# Patient Record
Sex: Female | Born: 1975 | Race: White | Hispanic: No | Marital: Married | Smoking: Never smoker
Health system: Southern US, Community
[De-identification: ages and names within clinical notes are randomized; demographics above are authoritative.]

## PROBLEM LIST (undated history)

## (undated) DIAGNOSIS — F319 Bipolar disorder, unspecified: Secondary | ICD-10-CM

## (undated) DIAGNOSIS — Z8659 Personal history of other mental and behavioral disorders: Secondary | ICD-10-CM

## (undated) DIAGNOSIS — D649 Anemia, unspecified: Secondary | ICD-10-CM

## (undated) DIAGNOSIS — R7303 Prediabetes: Secondary | ICD-10-CM

## (undated) DIAGNOSIS — Z87442 Personal history of urinary calculi: Secondary | ICD-10-CM

## (undated) DIAGNOSIS — R5383 Other fatigue: Secondary | ICD-10-CM

## (undated) HISTORY — PX: OTHER SURGICAL HISTORY: SHX169

---

## 2001-03-09 ENCOUNTER — Emergency Department (HOSPITAL_COMMUNITY): Admission: EM | Admit: 2001-03-09 | Discharge: 2001-03-09 | Payer: Self-pay | Admitting: Emergency Medicine

## 2001-03-09 ENCOUNTER — Encounter: Payer: Self-pay | Admitting: Emergency Medicine

## 2004-03-04 ENCOUNTER — Other Ambulatory Visit: Admission: RE | Admit: 2004-03-04 | Discharge: 2004-03-04 | Payer: Self-pay | Admitting: Obstetrics and Gynecology

## 2004-10-28 ENCOUNTER — Inpatient Hospital Stay (HOSPITAL_COMMUNITY): Admission: AD | Admit: 2004-10-28 | Discharge: 2004-10-31 | Payer: Self-pay | Admitting: Obstetrics and Gynecology

## 2004-10-29 ENCOUNTER — Encounter (INDEPENDENT_AMBULATORY_CARE_PROVIDER_SITE_OTHER): Payer: Self-pay | Admitting: Specialist

## 2004-12-06 ENCOUNTER — Encounter: Admission: RE | Admit: 2004-12-06 | Discharge: 2005-01-05 | Payer: Self-pay | Admitting: Obstetrics and Gynecology

## 2004-12-18 ENCOUNTER — Other Ambulatory Visit: Admission: RE | Admit: 2004-12-18 | Discharge: 2004-12-18 | Payer: Self-pay | Admitting: Obstetrics and Gynecology

## 2005-01-06 ENCOUNTER — Encounter: Admission: RE | Admit: 2005-01-06 | Discharge: 2005-02-05 | Payer: Self-pay | Admitting: Obstetrics and Gynecology

## 2005-03-06 ENCOUNTER — Encounter: Admission: RE | Admit: 2005-03-06 | Discharge: 2005-04-05 | Payer: Self-pay | Admitting: Obstetrics and Gynecology

## 2005-05-06 ENCOUNTER — Encounter: Admission: RE | Admit: 2005-05-06 | Discharge: 2005-06-05 | Payer: Self-pay | Admitting: Obstetrics and Gynecology

## 2005-07-06 ENCOUNTER — Encounter: Admission: RE | Admit: 2005-07-06 | Discharge: 2005-08-05 | Payer: Self-pay | Admitting: Obstetrics and Gynecology

## 2005-08-06 ENCOUNTER — Encounter: Admission: RE | Admit: 2005-08-06 | Discharge: 2005-09-04 | Payer: Self-pay | Admitting: Obstetrics and Gynecology

## 2005-09-05 ENCOUNTER — Encounter: Admission: RE | Admit: 2005-09-05 | Discharge: 2005-10-05 | Payer: Self-pay | Admitting: Obstetrics and Gynecology

## 2005-10-06 ENCOUNTER — Encounter: Admission: RE | Admit: 2005-10-06 | Discharge: 2005-11-04 | Payer: Self-pay | Admitting: Obstetrics and Gynecology

## 2005-11-05 ENCOUNTER — Encounter: Admission: RE | Admit: 2005-11-05 | Discharge: 2005-12-05 | Payer: Self-pay | Admitting: Obstetrics and Gynecology

## 2005-12-06 ENCOUNTER — Encounter: Admission: RE | Admit: 2005-12-06 | Discharge: 2006-01-05 | Payer: Self-pay | Admitting: Obstetrics and Gynecology

## 2006-01-06 ENCOUNTER — Encounter: Admission: RE | Admit: 2006-01-06 | Discharge: 2006-02-02 | Payer: Self-pay | Admitting: Obstetrics and Gynecology

## 2006-02-03 ENCOUNTER — Encounter: Admission: RE | Admit: 2006-02-03 | Discharge: 2006-03-05 | Payer: Self-pay | Admitting: Obstetrics and Gynecology

## 2006-02-27 ENCOUNTER — Other Ambulatory Visit: Admission: RE | Admit: 2006-02-27 | Discharge: 2006-02-27 | Payer: Self-pay | Admitting: Obstetrics and Gynecology

## 2006-03-06 ENCOUNTER — Encounter: Admission: RE | Admit: 2006-03-06 | Discharge: 2006-04-05 | Payer: Self-pay | Admitting: Obstetrics and Gynecology

## 2006-06-29 ENCOUNTER — Emergency Department (HOSPITAL_COMMUNITY): Admission: EM | Admit: 2006-06-29 | Discharge: 2006-06-29 | Payer: Self-pay | Admitting: Emergency Medicine

## 2007-02-24 ENCOUNTER — Emergency Department (HOSPITAL_COMMUNITY): Admission: EM | Admit: 2007-02-24 | Discharge: 2007-02-24 | Payer: Self-pay | Admitting: Emergency Medicine

## 2007-05-13 ENCOUNTER — Other Ambulatory Visit: Admission: RE | Admit: 2007-05-13 | Discharge: 2007-05-13 | Payer: Self-pay | Admitting: Obstetrics and Gynecology

## 2011-03-28 NOTE — Discharge Summary (Signed)
NAME:  Lauren Stafford, Lauren Stafford           ACCOUNT NO.:  [id number]   MEDICAL RECORD NO.:  [id number]          PATIENT TYPE:  INP   LOCATION:  9303                          FACILITY:  [facility]   PHYSICIAN:  James A. Ashley Royalty, M.D.DATE OF BIRTH:  02-12-1976   DATE OF ADMISSION:  10/28/2004  DATE OF DISCHARGE:  10/31/2004                                 DISCHARGE SUMMARY   DISCHARGE DIAGNOSES:  1.  Intrauterine pregnancy at 39 weeks 3 days gestation, delivered.  2.  Term birth, living child.  3.  Neonate under evaluation for possible cardiac and/or chromosomal      abnormality and transferred to [place] [facility] [place] at [facility] for completion of that workup.   CONSULTATIONS:  None.   DISCHARGE MEDICATIONS:  1.  Augmentin.  2.  Percocet.  3.  Motrin.  4.  Colace.   HISTORY AND PHYSICAL:  This is a 35 year old gravida 2 para 0 AB 1, EDC  November 01, 2004, currently 39 weeks 3 days gestation.  Prenatal care was  complicated by a vesicular eruption on the right index finger which was  cultured for herpes and found to be negative.  The patient also had modest  depression but no medications required.  She was admitted for induction of  labor due to advanced cervical changes and musculoskeletal discomfort.  For  the remainder of the history and physical, please see chart.   HOSPITAL COURSE:  The patient was admitted to [facility] [place].  Admission laboratory studies were drawn.  On October 29, 2004,  artificial rupture of membranes was accomplished, which revealed clear  fluid.  Full ____________ were placed.  Meconium was later noted.  In  addition, the patient developed a maternal fever consistent with the  diagnosis of chorioamnionitis.  She was later felt to be a candidate for an  instrument delivery due to fetal tachycardia and the low station of the  presenting part.   At 6:59 p.m., there was brought a 3421 g female, Apgars 6 at one minute and  8 at five  minutes, sent to the neonatal intensive care unit.  Delivery was  accomplished from a +3 station, LOA, with the vacuum extractor for the above  indications.  The infant was DeLeed on the perineum, and 3 cc of meconium  was noted.  Pediatrics team was present at delivery.  Arterial cord pH was  7.28.  Delivery was accomplished over a second-degree midline episiotomy  which did extend to a fourth degree and was repaired without difficulty.  The infant was taken by pediatrics team to the NICU secondary to grunting to  check for meconium aspiration.  Also, the feet and hands were very swollen,  and the etiology was not immediately known.  Workup was initiated.  The  patient was continued on IV antibiotics postpartum for the presumed  chorioamnionitis.   On October 30, 2004, the patient stated the infant was transferred to [facility] due to a presumed cardiac defect.  There was the possibility of a  chromosome anomaly as well.  The patient herself did  quite well from a  physical standpoint.  IV antibiotics were continued.  On October 31, 2004,  the patient continued to be afebrile.  She appeared stable for discharge and  was discharged home in satisfactory condition.  She also stated at that time  that the baby underwent an aortic valvulotomy at [facility] which went very  well.  Workup is continuing.   The patient was discharged home on the same calendar day afebrile and in  satisfactory condition.  Discharge hemoglobin was 8.9, hematocrit 25.7.  Full discharge instructions were given.      JAM/MEDQ  D:  11/27/2004  T:  11/27/2004  Job:  [id number]

## 2011-03-28 NOTE — H&P (Signed)
NAME:  Lauren Stafford, Lauren Stafford           ACCOUNT NO.:  [id number]   MEDICAL RECORD NO.:  [id number]           PATIENT TYPE:   LOCATION:                                 FACILITY:   PHYSICIAN:  Rudy Jew. Ashley Royalty, M.D.     DATE OF BIRTH:   DATE OF ADMISSION:  10/28/2004  DATE OF DISCHARGE:                                HISTORY & PHYSICAL   HISTORY OF PRESENT ILLNESS:  This is a 35 year old gravida 2, para 0, AB 1,  EDC November 01, 2004, currently 39 weeks, three days gestation.  Prenatal  care has been complicated by an eruption of the right index finger which was  cultured for Herpes and the culture was negative.  The patient also has had  modest depression, but has been under no treatment and had no complications  therein.   The patient is admitted for induction due to musculoskeletal discomfort in  the face of a favorable cervix.   MEDICATIONS:  Vitamins.   PAST MEDICAL HISTORY:  Negative, save for the aforementioned items.   PAST SURGICAL HISTORY:  History of throat surgery - benign.   ALLERGIES:  None.   FAMILY HISTORY:  Noncontributory.   SOCIAL HISTORY:  The patient denies use of tobacco or significant alcohol.   REVIEW OF SYSTEMS:  Noncontributory.   PHYSICAL EXAMINATION:  GENERAL:  Well-developed, well-nourished, pleasant  white female in no acute distress.  VITAL SIGNS:  Afebrile, vital signs stable.  Weight 200 pounds.  SKIN:  Warm and dry without lesions.  LYMPH:  There is no supraclavicular, cervical, or inguinal adenopathy.  HEENT:  Normocephalic.  NECK:  Supple without thyromegaly.  CHEST:  Lungs are clear.  CARDIAC:  Regular rate and rhythm without murmurs, gallops, or rubs.  BREASTS:  Soft without palpable mass, discharge, tracks, or adenopathy.  ABDOMEN:  Soft, nontender without organomegaly.  There is a term fundal  height.  Fetal heart tones are auscultating.  MUSCULOSKELETAL:  Examination reveals full range of motion without edema,  cyanosis, or CVA  tenderness.  PELVIC:  Cervix is loose, fingertip dilatation, 75%, -2 to -3 station,  vertex presentation.   IMPRESSION:  1.  Intrauterine pregnancy at 39 weeks, three days gestation.  2.  Musculoskeletal discomfort at term with favorable cervix.   PLAN:  The patient is admitted for induction.  She is group B Strep  negative.      JAM/MEDQ  D:  10/28/2004  T:  10/28/2004  Job:  [id number]

## 2014-09-03 ENCOUNTER — Encounter (HOSPITAL_COMMUNITY): Payer: Self-pay | Admitting: Emergency Medicine

## 2014-09-03 ENCOUNTER — Emergency Department (HOSPITAL_COMMUNITY)
Admission: EM | Admit: 2014-09-03 | Discharge: 2014-09-04 | Disposition: A | Discharge status: Home / Self Care | Payer: BC Managed Care – PPO | Attending: Emergency Medicine | Admitting: Emergency Medicine

## 2014-09-03 DIAGNOSIS — R1031 Right lower quadrant pain: Secondary | ICD-10-CM | POA: Diagnosis not present

## 2014-09-03 DIAGNOSIS — Z79899 Other long term (current) drug therapy: Secondary | ICD-10-CM | POA: Insufficient documentation

## 2014-09-03 DIAGNOSIS — R109 Unspecified abdominal pain: Secondary | ICD-10-CM | POA: Diagnosis present

## 2014-09-03 DIAGNOSIS — Z3202 Encounter for pregnancy test, result negative: Secondary | ICD-10-CM | POA: Diagnosis not present

## 2014-09-03 DIAGNOSIS — R319 Hematuria, unspecified: Secondary | ICD-10-CM

## 2014-09-03 LAB — URINALYSIS, ROUTINE W REFLEX MICROSCOPIC
Bilirubin Urine: NEGATIVE
GLUCOSE, UA: NEGATIVE mg/dL
KETONES UR: NEGATIVE mg/dL
Leukocytes, UA: NEGATIVE
NITRITE: NEGATIVE
PROTEIN: NEGATIVE mg/dL
Specific Gravity, Urine: 1.02 (ref 1.005–1.030)
Urobilinogen, UA: 0.2 mg/dL (ref 0.0–1.0)
pH: 5.5 (ref 5.0–8.0)

## 2014-09-03 LAB — COMPREHENSIVE METABOLIC PANEL
ALT: 33 U/L (ref 0–35)
AST: 20 U/L (ref 0–37)
Albumin: 3.7 g/dL (ref 3.5–5.2)
Alkaline Phosphatase: 80 U/L (ref 39–117)
Anion gap: 12 (ref 5–15)
BUN: 12 mg/dL (ref 6–23)
CO2: 27 mEq/L (ref 19–32)
Calcium: 9.9 mg/dL (ref 8.4–10.5)
Chloride: 103 mEq/L (ref 96–112)
Creatinine, Ser: 0.68 mg/dL (ref 0.50–1.10)
GFR calc Af Amer: >90 mL/min (ref >90)
GFR calc non Af Amer: >90 mL/min (ref >90)
Glucose, Bld: 96 mg/dL (ref 70–99)
Potassium: 4.3 mEq/L (ref 3.7–5.3)
Sodium: 142 mEq/L (ref 137–147)
Total Bilirubin: 0.2 mg/dL — ABNORMAL LOW (ref 0.3–1.2)
Total Protein: 7.8 g/dL (ref 6.0–8.3)

## 2014-09-03 LAB — CBC WITH DIFFERENTIAL/PLATELET
BASOS ABS: 0.1 10*3/uL (ref 0.0–0.1)
Basophils Relative: 1 % (ref 0–1)
EOS ABS: 0.3 10*3/uL (ref 0.0–0.7)
EOS PCT: 2 % (ref 0–5)
HCT: 35.6 % — ABNORMAL LOW (ref 36.0–46.0)
Hemoglobin: 11.6 g/dL — ABNORMAL LOW (ref 12.0–15.0)
LYMPHS ABS: 3.2 10*3/uL (ref 0.7–4.0)
LYMPHS PCT: 27 % (ref 12–46)
MCH: 25.9 pg — AB (ref 26.0–34.0)
MCHC: 32.6 g/dL (ref 30.0–36.0)
MCV: 79.5 fL (ref 78.0–100.0)
Monocytes Absolute: 0.8 10*3/uL (ref 0.1–1.0)
Monocytes Relative: 6 % (ref 3–12)
NEUTROS PCT: 64 % (ref 43–77)
Neutro Abs: 7.7 10*3/uL (ref 1.7–7.7)
Platelets: 345 10*3/uL (ref 150–400)
RBC: 4.48 MIL/uL (ref 3.87–5.11)
RDW: 14.2 % (ref 11.5–15.5)
WBC: 12 10*3/uL — AB (ref 4.0–10.5)

## 2014-09-03 LAB — URINE MICROSCOPIC-ADD ON

## 2014-09-03 LAB — PREGNANCY, URINE: PREG TEST UR: NEGATIVE

## 2014-09-03 MED ORDER — MORPHINE SULFATE 4 MG/ML IJ SOLN
4.0000 mg | Freq: Once | INTRAMUSCULAR | Status: AC
  Administered 2014-09-03: 4 mg via INTRAVENOUS
  Filled 2014-09-03: qty 1

## 2014-09-03 MED ORDER — KETOROLAC TROMETHAMINE 15 MG/ML IJ SOLN
15.0000 mg | Freq: Once | INTRAMUSCULAR | Status: AC
  Administered 2014-09-03: 15 mg via INTRAVENOUS
  Filled 2014-09-03: qty 1

## 2014-09-03 NOTE — ED Notes (Signed)
Pt. reports right flank pain and "tingling " when urinating onset Friday last week , denies dysuria or hematuria , no fever or chills.

## 2014-09-04 MED ORDER — ONDANSETRON 4 MG PO TBDP: ORAL_TABLET | ORAL | Status: DC

## 2014-09-04 MED ORDER — HYDROCODONE-ACETAMINOPHEN 5-325 MG PO TABS: 1.0000 | ORAL_TABLET | ORAL | Status: DC | PRN

## 2014-09-04 NOTE — ED Provider Notes (Signed)
CSN: [id number]     Arrival date & time 09/03/14  1937 History   First MD Initiated Contact with Patient 09/03/14 2301     Chief Complaint  Patient presents with  . Flank Pain     (Consider location/radiation/quality/duration/timing/severity/associated sxs/prior Treatment) HPI Comments: 38 year old female with no significant medical history presents with right flank pain intermittent and tingling pressure when urinating since Thursday or Friday last week. Symptoms are intermittent and patient has times of no symptoms at all. No history of kidney stones or appendicitis. Currently symptoms mild. Pain comes in waves. No family history of kidney stones known, possibly in the mother.  Patient is a 38 y.o. female presenting with flank pain. The history is provided by the patient.  Flank Pain Pertinent negatives include no chest pain, no abdominal pain, no headaches and no shortness of breath.    History reviewed. No pertinent past medical history. Past Surgical History  Procedure Laterality Date  . Arm surgery     No family history on file. History  Substance Use Topics  . Smoking status: Never Smoker   . Smokeless tobacco: Not on file  . Alcohol Use: Yes   OB History   Grav Para Term Preterm Abortions TAB SAB Ect Mult Living                 Review of Systems  Constitutional: Positive for appetite change. Negative for fever and chills.  HENT: Negative for congestion.   Eyes: Negative for visual disturbance.  Respiratory: Negative for shortness of breath.   Cardiovascular: Negative for chest pain.  Gastrointestinal: Negative for vomiting and abdominal pain.  Genitourinary: Positive for flank pain. Negative for dysuria.  Musculoskeletal: Negative for back pain, neck pain and neck stiffness.  Skin: Negative for rash.  Neurological: Negative for light-headedness and headaches.      Allergies  Review of patient's allergies indicates no known allergies.  Home Medications    Prior to Admission medications   Medication Sig Start Date End Date Taking? Authorizing Provider  lithium carbonate 300 MG capsule Take 300 mg by mouth at bedtime.   Yes Historical Provider, MD  HYDROcodone-acetaminophen (NORCO) 5-325 MG per tablet Take 1-2 tablets by mouth every 4 (four) hours as needed. 09/04/14   Enid SkeensJoshua M Romelle Reiley, MD  ondansetron (ZOFRAN ODT) 4 MG disintegrating tablet 4mg  ODT q4 hours prn nausea/vomit 09/04/14   Enid SkeensJoshua M Trigger Frasier, MD   BP 115/67  Pulse 72  Temp(Src) 98.6 F (37 C) (Oral)  Resp 18  Ht 5\' 8"  (1.727 m)  Wt 223 lb (101.152 kg)  BMI 33.91 kg/m2  SpO2 100%  LMP 08/23/2014 Physical Exam  Nursing note and vitals reviewed. Constitutional: She is oriented to person, place, and time. She appears well-developed and well-nourished.  HENT:  Head: Normocephalic and atraumatic.  Eyes: Conjunctivae are normal. Right eye exhibits no discharge. Left eye exhibits no discharge.  Neck: Normal range of motion. Neck supple. No tracheal deviation present.  Cardiovascular: Normal rate and regular rhythm.   Pulmonary/Chest: Effort normal and breath sounds normal.  Abdominal: Soft. She exhibits no distension. There is no tenderness. There is no guarding.  Musculoskeletal: She exhibits tenderness (mild right lower flank). She exhibits no edema.  Neurological: She is alert and oriented to person, place, and time.  Skin: Skin is warm. No rash noted.  Psychiatric: She has a normal mood and affect.    ED Course  Procedures (including critical care time) Emergency Focused Ultrasound Exam Limited retroperitoneal ultrasound of  kidneys  Performed and interpreted by Dr. Jodi MourningZavitz Indication: flank pain Focused abdominal ultrasound with both kidneys imaged in transverse and longitudinal planes in real-time. Interpretation: possible mild hydroureter right, no hydronephrosis visualized.   Images archived electronically  Labs Review Labs Reviewed  COMPREHENSIVE METABOLIC PANEL  - Abnormal; Notable for the following:    Total Bilirubin 0.2 (*)    All other components within normal limits  CBC WITH DIFFERENTIAL - Abnormal; Notable for the following:    WBC 12.0 (*)    Hemoglobin 11.6 (*)    HCT 35.6 (*)    MCH 25.9 (*)    All other components within normal limits  URINALYSIS, ROUTINE W REFLEX MICROSCOPIC - Abnormal; Notable for the following:    Hgb urine dipstick MODERATE (*)    All other components within normal limits  PREGNANCY, URINE  URINE MICROSCOPIC-ADD ON    Imaging Review No results found.   EKG Interpretation None      MDM   Final diagnoses:  Right flank pain  Hematuria   Patient presents clinically most likely kidney stone. Patient has benign abdominal exam no peritonitis. I discussed differential diagnosis including atypical appendicitis, colitis however with blood in the urine, no significant abdominal pain at this time and intermittent right flank pain patient significant other myself all agree to hold on CT scan at this time and give her 48 hours and monitor symptoms. Patient will return if right lower quadrant pain develops and is persistent, vomiting persistent, fevers or other new or worsening symptoms.  Results and differential diagnosis were discussed with the patient/parent/guardian. Close follow up outpatient was discussed, comfortable with the plan.   Medications  ketorolac (TORADOL) 15 MG/ML injection 15 mg (15 mg Intravenous Given 09/03/14 2352)  morphine 4 MG/ML injection 4 mg (4 mg Intravenous Given 09/03/14 2352)    Filed Vitals:   09/03/14 2315 09/03/14 2330 09/03/14 2345 09/04/14 0015  BP: 124/68 130/80 115/67 119/71  Pulse: 67 73 72 78  Temp:      TempSrc:      Resp:      Height:      Weight:      SpO2: 100% 100% 100% 97%    Final diagnoses:  Right flank pain  Hematuria       Enid SkeensJoshua M Ellyana Crigler, MD 09/04/14 [id number]

## 2014-09-04 NOTE — Discharge Instructions (Signed)
If you were given medicines take as directed.  If you are on coumadin or contraceptives realize their levels and effectiveness is altered by many different medicines.  If you have any reaction (rash, tongues swelling, other) to the medicines stop taking and see a physician.  Take ibuprofen and Tylenol for pain, stay well-hydrated. For severe pain take norco or vicodin however realize they have the potential for addiction and it can make you sleepy and has tylenol in it.  No operating machinery while taking. If your abdominal pain worsens, you develop fevers, persistent vomiting or if your pain moves to the right lower quadrant return immediately to see your physician or come to the Emergency Department.  Thank you   Please follow up as directed and return to the ER or see a physician for new or worsening symptoms.  Thank you. Filed Vitals:   09/03/14 2300 09/03/14 2315 09/03/14 2330 09/03/14 2345  BP: 120/66 124/68 130/80 115/67  Pulse:  67 73 72  Temp:      TempSrc:      Resp:      Height:      Weight:      SpO2:  100% 100% 100%

## 2018-09-21 ENCOUNTER — Ambulatory Visit: Payer: BLUE CROSS/BLUE SHIELD | Admitting: Registered"

## 2019-06-05 ENCOUNTER — Emergency Department (HOSPITAL_COMMUNITY)
Admission: EM | Admit: 2019-06-05 | Discharge: 2019-06-05 | Disposition: A | Discharge status: Home / Self Care | Payer: Commercial Managed Care - PPO | Attending: Emergency Medicine | Admitting: Emergency Medicine

## 2019-06-05 ENCOUNTER — Encounter (HOSPITAL_COMMUNITY): Payer: Self-pay | Admitting: Emergency Medicine

## 2019-06-05 ENCOUNTER — Other Ambulatory Visit: Payer: Self-pay

## 2019-06-05 DIAGNOSIS — R079 Chest pain, unspecified: Secondary | ICD-10-CM | POA: Insufficient documentation

## 2019-06-05 NOTE — ED Triage Notes (Signed)
Pt here for eval of chest pressure since yesterday, denies fever chills cough. Pt endorses some shortness of breath.

## 2019-06-05 NOTE — ED Provider Notes (Signed)
[facility] EMERGENCY DEPARTMENT Provider Note   CSN: [id number] Arrival date & time: 06/05/19  1510    History   Chief Complaint Chief Complaint  Patient presents with  . Chest Pain    HPI Lauren Stafford is a 43 y.o. female who hx of panic attacks and anxiety as well as migraine headaches. She reports onset of pressure like cp radiating to her back that came suddenly at 2 PM, She has continued sensation of tightness in the chest. Similar to previous panic attacks never had pain in the past.  Patient does not smoke, no known HLD or HTN.   She denies history of smoking, unilateral leg swelling or pain, recent travel or confinement, use of exogenous estrogens, previous DVT or cancer.  HPI: A 43 year old patient with a history of hypertension and obesity presents for evaluation of chest pain. Initial onset of pain was approximately 1-3 hours ago. The patient's chest pain is not worse with exertion. The patient's chest pain is middle- or left-sided, is not well-localized, is not described as heaviness/pressure/tightness, is not sharp and does not radiate to the arms/jaw/neck. The patient does not complain of nausea and denies diaphoresis. The patient has no history of stroke, has no history of peripheral artery disease, has not smoked in the past 90 days, denies any history of treated diabetes, has no relevant family history of coronary artery disease (first degree relative at less than age 78) and has no history of hypercholesterolemia.   HPI  History reviewed. No pertinent past medical history.  There are no active problems to display for this patient.   Past Surgical History:  Procedure Laterality Date  . arm surgery       OB History   No obstetric history on file.      Home Medications    Prior to Admission medications   Medication Sig Start Date End Date Taking? Authorizing Provider  HYDROcodone-acetaminophen (NORCO) 5-325 MG per tablet Take 1-2  tablets by mouth every 4 (four) hours as needed. 09/04/14   Elnora Morrison, MD  lithium carbonate 300 MG capsule Take 300 mg by mouth at bedtime.    [provider]  ondansetron (ZOFRAN ODT) 4 MG disintegrating tablet 4mg  ODT q4 hours prn nausea/vomit 09/04/14   Elnora Morrison, MD    Family History No family history on file.  Social History Social History   Tobacco Use  . Smoking status: Never Smoker  Substance Use Topics  . Alcohol use: Yes  . Drug use: No     Allergies   Patient has no known allergies.   Review of Systems Review of Systems  Ten systems reviewed and are negative for acute change, except as noted in the HPI.   Physical Exam Updated Vital Signs BP (!) 165/99   Pulse 100   Resp 19   LMP 05/29/2019   SpO2 99%   Physical Exam Vitals signs and nursing note reviewed.  Constitutional:      General: She is not in acute distress.    Appearance: She is well-developed. She is not diaphoretic.  HENT:     Head: Normocephalic and atraumatic.  Eyes:     General: No scleral icterus.    Conjunctiva/sclera: Conjunctivae normal.  Neck:     Musculoskeletal: Normal range of motion.  Cardiovascular:     Rate and Rhythm: Normal rate and regular rhythm.     Heart sounds: Normal heart sounds. No murmur. No friction rub. No gallop.   Pulmonary:  Effort: Pulmonary effort is normal. No respiratory distress.     Breath sounds: Normal breath sounds.  Abdominal:     General: Bowel sounds are normal. There is no distension.     Palpations: Abdomen is soft. There is no mass.     Tenderness: There is no abdominal tenderness. There is no guarding.  Skin:    General: Skin is warm and dry.  Neurological:     Mental Status: She is alert and oriented to person, place, and time.  Psychiatric:        Behavior: Behavior normal.      ED Treatments / Results  Labs (all labs ordered are listed, but only abnormal results are displayed) Labs Reviewed - No data to  display  EKG EKG Interpretation  Date/Time:  Sunday June 05 2019 15:21:38 EDT Ventricular Rate:  106 PR Interval:  118 QRS Duration: 84 QT Interval:  370 QTC Calculation: 491 R Axis:   53 Text Interpretation:  Sinus tachycardia Otherwise normal ECG Confirmed by Tilden Fossaees, Elizabeth [phone]([id number]) on 06/05/2019 3:23:00 PM   Radiology No results found.  Procedures Procedures (including critical care time)  Medications Ordered in ED Medications - No data to display   Initial Impression / Assessment and Plan / ED Course  I have reviewed the triage vital signs and the nursing notes.  Pertinent labs & imaging results that were available during my care of the patient were reviewed by me and considered in my medical decision making (see chart for details).     HEAR Score: 541  43 year old female here with onset of chest pain, chest pressure.  The patient is adamant that this was only an anxiety attack and she does not wish to continue with any further work-up.  I had a long discussion with the patient by the bedside who is fully competent to make a decision.  She understands that I have not ruled out any emergent cause including acute myocardial infarction or ACS although currently her heart score is 1 without high-sensitivity troponin and secondly I cannot rule out pulmonary embolus as a cause of her tachycardia and that either of these could certainly cause her to die or have severe long-term morbidities.  I have not gotten a chest x-ray to rule out any other cause of her symptoms intrathoracic light.  The patient is fully aware of the risks of leaving including severe morbidity or mortality she is competent to make this decision.  She understands the risks of leaving and what we have not done here at the emergency department.  She feels comfortable leaving at this time I have discussed that she may return at any time day or night immediately for further work-up and encouraged her to return should she  have any concerns.  The patient will be discharged at this time with a full understanding of her risk. Final Clinical Impressions(s) / ED Diagnoses   Final diagnoses:  Nonspecific chest pain    ED Discharge Orders    None       Arthor CaptainHarris, Aariv Medlock, PA-C 06/05/19 1639    Tilden Fossaees, Elizabeth, MD 06/06/19 1040

## 2019-06-05 NOTE — ED Notes (Signed)
Patient is stating that her chest pain is resolved and she thinks it was anxiety and wants to go home. EDP made aware.

## 2019-06-05 NOTE — Discharge Instructions (Addendum)

## 2019-09-30 ENCOUNTER — Other Ambulatory Visit: Payer: Self-pay

## 2019-09-30 DIAGNOSIS — Z20822 Contact with and (suspected) exposure to covid-19: Secondary | ICD-10-CM

## 2019-10-03 LAB — NOVEL CORONAVIRUS, NAA: SARS-CoV-2, NAA: NOT DETECTED

## 2020-01-27 ENCOUNTER — Ambulatory Visit: Payer: Commercial Managed Care - PPO | Attending: Internal Medicine

## 2020-01-27 DIAGNOSIS — Z20822 Contact with and (suspected) exposure to covid-19: Secondary | ICD-10-CM

## 2020-01-28 LAB — NOVEL CORONAVIRUS, NAA: SARS-CoV-2, NAA: NOT DETECTED

## 2021-03-13 ENCOUNTER — Encounter (HOSPITAL_BASED_OUTPATIENT_CLINIC_OR_DEPARTMENT_OTHER): Payer: Self-pay

## 2021-03-13 ENCOUNTER — Emergency Department (HOSPITAL_BASED_OUTPATIENT_CLINIC_OR_DEPARTMENT_OTHER): Payer: Commercial Managed Care - PPO

## 2021-03-13 ENCOUNTER — Emergency Department (HOSPITAL_BASED_OUTPATIENT_CLINIC_OR_DEPARTMENT_OTHER)
Admission: EM | Admit: 2021-03-13 | Discharge: 2021-03-13 | Disposition: A | Discharge status: Home / Self Care | Payer: Commercial Managed Care - PPO | Attending: Emergency Medicine | Admitting: Emergency Medicine

## 2021-03-13 ENCOUNTER — Other Ambulatory Visit: Payer: Self-pay

## 2021-03-13 DIAGNOSIS — D649 Anemia, unspecified: Secondary | ICD-10-CM | POA: Insufficient documentation

## 2021-03-13 DIAGNOSIS — Z20822 Contact with and (suspected) exposure to covid-19: Secondary | ICD-10-CM | POA: Diagnosis not present

## 2021-03-13 DIAGNOSIS — R0600 Dyspnea, unspecified: Secondary | ICD-10-CM | POA: Diagnosis not present

## 2021-03-13 DIAGNOSIS — R059 Cough, unspecified: Secondary | ICD-10-CM | POA: Insufficient documentation

## 2021-03-13 DIAGNOSIS — R002 Palpitations: Secondary | ICD-10-CM | POA: Insufficient documentation

## 2021-03-13 DIAGNOSIS — R0602 Shortness of breath: Secondary | ICD-10-CM | POA: Diagnosis present

## 2021-03-13 LAB — COMPREHENSIVE METABOLIC PANEL
ALT: 31 U/L (ref 0–44)
AST: 19 U/L (ref 15–41)
Albumin: 3.6 g/dL (ref 3.5–5.0)
Alkaline Phosphatase: 63 U/L (ref 38–126)
Anion gap: 9 (ref 5–15)
BUN: 13 mg/dL (ref 6–20)
CO2: 25 mmol/L (ref 22–32)
Calcium: 9.2 mg/dL (ref 8.9–10.3)
Chloride: 103 mmol/L (ref 98–111)
Creatinine, Ser: 0.66 mg/dL (ref 0.44–1.00)
GFR, Estimated: >60 mL/min (ref >60)
Glucose, Bld: 111 mg/dL — ABNORMAL HIGH (ref 70–99)
Potassium: 3.4 mmol/L — ABNORMAL LOW (ref 3.5–5.1)
Sodium: 137 mmol/L (ref 135–145)
Total Bilirubin: 0.3 mg/dL (ref 0.3–1.2)
Total Protein: 7.3 g/dL (ref 6.5–8.1)

## 2021-03-13 LAB — CBC WITH DIFFERENTIAL/PLATELET
Abs Immature Granulocytes: 0.22 10*3/uL — ABNORMAL HIGH (ref 0.00–0.07)
Basophils Absolute: 0.1 10*3/uL (ref 0.0–0.1)
Basophils Relative: 1 %
Eosinophils Absolute: 0.1 10*3/uL (ref 0.0–0.5)
Eosinophils Relative: 1 %
HCT: 27.1 % — ABNORMAL LOW (ref 36.0–46.0)
Hemoglobin: 8 g/dL — ABNORMAL LOW (ref 12.0–15.0)
Immature Granulocytes: 1 %
Lymphocytes Relative: 34 %
Lymphs Abs: 6.4 10*3/uL — ABNORMAL HIGH (ref 0.7–4.0)
MCH: 18.7 pg — ABNORMAL LOW (ref 26.0–34.0)
MCHC: 29.5 g/dL — ABNORMAL LOW (ref 30.0–36.0)
MCV: 63.3 fL — ABNORMAL LOW (ref 80.0–100.0)
Monocytes Absolute: 1.4 10*3/uL — ABNORMAL HIGH (ref 0.1–1.0)
Monocytes Relative: 7 %
Neutro Abs: 10.6 10*3/uL — ABNORMAL HIGH (ref 1.7–7.7)
Neutrophils Relative %: 56 %
Platelets: 519 10*3/uL — ABNORMAL HIGH (ref 150–400)
RBC: 4.28 MIL/uL (ref 3.87–5.11)
RDW: 18.4 % — ABNORMAL HIGH (ref 11.5–15.5)
Smear Review: INCREASED
WBC: 18.8 10*3/uL — ABNORMAL HIGH (ref 4.0–10.5)
nRBC: 0 % (ref 0.0–0.2)

## 2021-03-13 LAB — D-DIMER, QUANTITATIVE: D-Dimer, Quant: 0.65 ug/mL-FEU — ABNORMAL HIGH (ref 0.00–0.50)

## 2021-03-13 LAB — LACTIC ACID, PLASMA: Lactic Acid, Venous: 1 mmol/L (ref 0.5–1.9)

## 2021-03-13 LAB — URINALYSIS, ROUTINE W REFLEX MICROSCOPIC
Bilirubin Urine: NEGATIVE
Glucose, UA: NEGATIVE mg/dL
Ketones, ur: NEGATIVE mg/dL
Leukocytes,Ua: NEGATIVE
Nitrite: NEGATIVE
Protein, ur: NEGATIVE mg/dL
Specific Gravity, Urine: 1.02 (ref 1.005–1.030)
pH: 6 (ref 5.0–8.0)

## 2021-03-13 LAB — RESP PANEL BY RT-PCR (FLU A&B, COVID) ARPGX2
Influenza A by PCR: NEGATIVE
Influenza B by PCR: NEGATIVE
SARS Coronavirus 2 by RT PCR: NEGATIVE

## 2021-03-13 LAB — TROPONIN I (HIGH SENSITIVITY): Troponin I (High Sensitivity): 3 ng/L (ref <18)

## 2021-03-13 LAB — URINALYSIS, MICROSCOPIC (REFLEX)

## 2021-03-13 LAB — BRAIN NATRIURETIC PEPTIDE: B Natriuretic Peptide: 53.2 pg/mL (ref 0.0–100.0)

## 2021-03-13 LAB — LIPASE, BLOOD: Lipase: 32 U/L (ref 11–51)

## 2021-03-13 MED ORDER — IOHEXOL 350 MG/ML SOLN
80.0000 mL | Freq: Once | INTRAVENOUS | Status: AC | PRN
  Administered 2021-03-13: 80 mL via INTRAVENOUS

## 2021-03-13 NOTE — ED Provider Notes (Signed)
[facility] EMERGENCY DEPARTMENT Provider Note   CSN: [id number] Arrival date & time: 03/13/21  1943     History Chief Complaint  Patient presents with  . Shortness of Breath  . Palpitations    Lauren Stafford is a 45 y.o. female.  The history is provided by the patient.  Shortness of Breath Severity:  Mild Onset quality:  Gradual Timing:  Intermittent Progression:  Waxing and waning Chronicity:  New Context: activity   Relieved by:  Nothing Worsened by:  Nothing Associated symptoms: cough   Associated symptoms: no abdominal pain, no chest pain, no ear pain, no fever, no rash, no sore throat and no vomiting   Risk factors: no hx of PE/DVT        History reviewed. No pertinent past medical history.  There are no problems to display for this patient.   Past Surgical History:  Procedure Laterality Date  . arm surgery       OB History   No obstetric history on file.     History reviewed. No pertinent family history.  Social History   Tobacco Use  . Smoking status: Never Smoker  Substance Use Topics  . Alcohol use: Not Currently  . Drug use: No    Home Medications Prior to Admission medications   Medication Sig Start Date End Date Taking? Authorizing Provider  HYDROcodone-acetaminophen (NORCO) 5-325 MG per tablet Take 1-2 tablets by mouth every 4 (four) hours as needed. 09/04/14   Blane Ohara, MD  lithium carbonate 300 MG capsule Take 300 mg by mouth at bedtime.    [provider]  ondansetron (ZOFRAN ODT) 4 MG disintegrating tablet 4mg  ODT q4 hours prn nausea/vomit 09/04/14   09/06/14, MD    Allergies    Patient has no known allergies.  Review of Systems   Review of Systems  Constitutional: Negative for chills and fever.  HENT: Positive for sinus pressure. Negative for ear pain and sore throat.   Eyes: Negative for pain and visual disturbance.  Respiratory: Positive for cough and shortness of breath.    Cardiovascular: Negative for chest pain and palpitations.  Gastrointestinal: Negative for abdominal pain and vomiting.  Genitourinary: Negative for dysuria and hematuria.  Musculoskeletal: Negative for arthralgias and back pain.  Skin: Negative for color change and rash.  Neurological: Negative for seizures and syncope.  All other systems reviewed and are negative.   Physical Exam Updated Vital Signs BP (!) 148/73   Pulse 87   Temp 99.5 F (37.5 C) (Rectal)   Resp 16   Ht 5\' 8"  (1.727 m)   Wt 104.8 kg   LMP 02/20/2021   SpO2 98%   BMI 35.12 kg/m   Physical Exam Vitals and nursing note reviewed.  Constitutional:      General: She is not in acute distress.    Appearance: She is well-developed.  HENT:     Head: Normocephalic and atraumatic.  Eyes:     Conjunctiva/sclera: Conjunctivae normal.     Pupils: Pupils are equal, round, and reactive to light.  Cardiovascular:     Rate and Rhythm: Normal rate and regular rhythm.     Pulses: Normal pulses.     Heart sounds: Normal heart sounds. No murmur heard.   Pulmonary:     Effort: Pulmonary effort is normal. No respiratory distress.     Breath sounds: Normal breath sounds. No decreased breath sounds or wheezing.  Abdominal:     Palpations: Abdomen is soft.  Tenderness: There is no abdominal tenderness.  Genitourinary:    Rectum: Guaiac result negative (brown stool on exam).  Musculoskeletal:     Cervical back: Neck supple.     Right lower leg: Edema (trace) present.     Left lower leg: Edema (trace) present.  Skin:    General: Skin is warm and dry.     Capillary Refill: Capillary refill takes less than 2 seconds.  Neurological:     General: No focal deficit present.     Mental Status: She is alert.     ED Results / Procedures / Treatments   Labs (all labs ordered are listed, but only abnormal results are displayed) Labs Reviewed  CBC WITH DIFFERENTIAL/PLATELET - Abnormal; Notable for the following  components:      Result Value   WBC 18.8 (*)    Hemoglobin 8.0 (*)    HCT 27.1 (*)    MCV 63.3 (*)    MCH 18.7 (*)    MCHC 29.5 (*)    RDW 18.4 (*)    Platelets 519 (*)    Neutro Abs 10.6 (*)    Lymphs Abs 6.4 (*)    Monocytes Absolute 1.4 (*)    Abs Immature Granulocytes 0.22 (*)    All other components within normal limits  COMPREHENSIVE METABOLIC PANEL - Abnormal; Notable for the following components:   Potassium 3.4 (*)    Glucose, Bld 111 (*)    All other components within normal limits  URINALYSIS, ROUTINE W REFLEX MICROSCOPIC - Abnormal; Notable for the following components:   Hgb urine dipstick TRACE (*)    All other components within normal limits  D-DIMER, QUANTITATIVE - Abnormal; Notable for the following components:   D-Dimer, Quant 0.65 (*)    All other components within normal limits  URINALYSIS, MICROSCOPIC (REFLEX) - Abnormal; Notable for the following components:   Bacteria, UA FEW (*)    All other components within normal limits  RESP PANEL BY RT-PCR (FLU A&B, COVID) ARPGX2  CULTURE, BLOOD (ROUTINE X 2)  CULTURE, BLOOD (ROUTINE X 2)  LIPASE, BLOOD  LACTIC ACID, PLASMA  BRAIN NATRIURETIC PEPTIDE  TROPONIN I (HIGH SENSITIVITY)    EKG EKG Interpretation  Date/Time:  Wednesday Mar 13 2021 19:57:46 EDT Ventricular Rate:  102 PR Interval:  132 QRS Duration: 94 QT Interval:  349 QTC Calculation: 455 R Axis:   31 Text Interpretation: Sinus tachycardia Borderline repolarization abnormality Confirmed by Virgina Norfolk ([id number]) on 03/13/2021 8:15:41 PM   Radiology CT Angio Chest PE W and/or Wo Contrast  Result Date: 03/13/2021 CLINICAL DATA:  Shortness of breath EXAM: CT ANGIOGRAPHY CHEST WITH CONTRAST TECHNIQUE: Multidetector CT imaging of the chest was performed using the standard protocol during bolus administration of intravenous contrast. Multiplanar CT image reconstructions and MIPs were obtained to evaluate the vascular anatomy. CONTRAST:  73mL OMNIPAQUE  IOHEXOL 350 MG/ML SOLN COMPARISON:  Chest x-ray 03/13/2021 FINDINGS: Cardiovascular: Contrast opacification of pulmonary arterial system is poor. No gross acute filling defect within the main or proximal lobar arteries. Approaching nondiagnostic for segmental and distal vessels. Nonaneurysmal aorta. No dissection. No significant pericardial effusion Mediastinum/Nodes: Midline trachea. No thyroid mass. No suspicious nodes. Lungs/Pleura: Lungs are clear. No pleural effusion or pneumothorax. Upper Abdomen: No acute abnormality. Musculoskeletal: None Review of the MIP images confirms the above findings. IMPRESSION: 1. Poor contrast bolus for evaluation of PE. No gross central embolus is seen; essentially nondiagnostic study for segmental and distal emboli. 2. Clear lung fields Electronically Signed  By: Jasmine Pang M.D.   On: 03/13/2021 21:37   DG Chest Portable 1 View  Result Date: 03/13/2021 CLINICAL DATA:  Cough EXAM: PORTABLE CHEST 1 VIEW COMPARISON:  06/30/2006 FINDINGS: The heart size and mediastinal contours are within normal limits. Both lungs are clear. The visualized skeletal structures are unremarkable. Mild aortic atherosclerosis. IMPRESSION: No active disease. Electronically Signed   By: Jasmine Pang M.D.   On: 03/13/2021 20:35    Procedures Procedures   Medications Ordered in ED Medications  iohexol (OMNIPAQUE) 350 MG/ML injection 80 mL (80 mLs Intravenous Contrast Given 03/13/21 2115)    ED Course  I have reviewed the triage vital signs and the nursing notes.  Pertinent labs & imaging results that were available during my care of the patient were reviewed by me and considered in my medical decision making (see chart for details).    MDM Rules/Calculators/A&P                          Lahna Nyx Keady is here with shortness of breath, cough, palpitations.  Patient arrives tachycardic but sinus rhythm.  Mildly tachycardic in the low 100s.  Rectal temperature 99.5.  Otherwise  vitals normal.  Cough and shortness of breath for the last several days.  Having palpitations.  Denies any chest pain.  Infectious work-up initiated as concern for possible pneumonia or COVID.  However also concern for possible PE.  Will get D-dimer.  We will get a troponin.  Troponin normal, BNP normal.  Doubt heart failure or ACS.  D-dimer was mildly elevated but CT scan of the chest showed no obvious PE.  Not an ideal study but no large blood clot.  She is not hypoxic.  Tachycardia has improved.  Overall have low suspicion for tiny blood clot.  No pneumonia.  Hemoglobin is 8.  MCV is less than 70.  Overall suspect that she is having the start of symptomatic anemia.  She has not had any syncopal episodes.  She has brown stool on exam.  She has been told to be on iron in the past but she has not been able to tolerate it.  Overall she is not at the point of needing blood transfusion.  She has normal blood pressure.  Tachycardia has improved to the 80s.  No signs of active bleeding.  Will refer her to hematology and overall suspect anemia of the cause of her intermittent symptoms.  This chart was dictated using voice recognition software.  Despite best efforts to proofread,  errors can occur which can change the documentation meaning.    Final Clinical Impression(s) / ED Diagnoses Final diagnoses:  Dyspnea, unspecified type  Anemia, unspecified type    Rx / DC Orders ED Discharge Orders    None       Virgina Norfolk, DO 03/13/21 2309

## 2021-03-13 NOTE — ED Triage Notes (Signed)
Pt states has been sick the past 6 days, 2 negative rapid covid tests. Increasing SOB and palpitations the past 2 days. Has had cough

## 2021-03-14 ENCOUNTER — Telehealth: Payer: Self-pay | Admitting: Physician Assistant

## 2021-03-14 LAB — CULTURE, BLOOD (ROUTINE X 2): Culture: NO GROWTH

## 2021-03-14 NOTE — Telephone Encounter (Signed)
I received a call from Ms. Lauren Stafford to schedule a new hem appt for anemia. She was seen in the ED yesterday and was told to f/u with a hematologist. She has been scheduled to see Karena Addison on 4/6 at 11am. Pt aware to arrive 20 minutes early.

## 2021-03-15 ENCOUNTER — Other Ambulatory Visit: Payer: Self-pay

## 2021-03-15 ENCOUNTER — Inpatient Hospital Stay: Payer: Commercial Managed Care - PPO

## 2021-03-15 ENCOUNTER — Encounter: Payer: Self-pay | Admitting: Physician Assistant

## 2021-03-15 ENCOUNTER — Inpatient Hospital Stay: Payer: Commercial Managed Care - PPO | Attending: Physician Assistant | Admitting: Physician Assistant

## 2021-03-15 VITALS — BP 152/87 | HR 87 | Temp 97.8°F | Resp 18 | Ht 68.0 in | Wt 235.9 lb

## 2021-03-15 DIAGNOSIS — D75839 Thrombocytosis, unspecified: Secondary | ICD-10-CM

## 2021-03-15 DIAGNOSIS — D72825 Bandemia: Secondary | ICD-10-CM | POA: Diagnosis not present

## 2021-03-15 DIAGNOSIS — N920 Excessive and frequent menstruation with regular cycle: Secondary | ICD-10-CM | POA: Insufficient documentation

## 2021-03-15 DIAGNOSIS — D509 Iron deficiency anemia, unspecified: Secondary | ICD-10-CM

## 2021-03-15 DIAGNOSIS — D500 Iron deficiency anemia secondary to blood loss (chronic): Secondary | ICD-10-CM | POA: Insufficient documentation

## 2021-03-15 DIAGNOSIS — J00 Acute nasopharyngitis [common cold]: Secondary | ICD-10-CM | POA: Diagnosis not present

## 2021-03-15 DIAGNOSIS — J069 Acute upper respiratory infection, unspecified: Secondary | ICD-10-CM | POA: Insufficient documentation

## 2021-03-15 DIAGNOSIS — D72829 Elevated white blood cell count, unspecified: Secondary | ICD-10-CM | POA: Insufficient documentation

## 2021-03-15 LAB — CBC WITH DIFFERENTIAL (CANCER CENTER ONLY)
Abs Immature Granulocytes: 0.18 10*3/uL — ABNORMAL HIGH (ref 0.00–0.07)
Basophils Absolute: 0.1 10*3/uL (ref 0.0–0.1)
Basophils Relative: 1 %
Eosinophils Absolute: 0.2 10*3/uL (ref 0.0–0.5)
Eosinophils Relative: 2 %
HCT: 27.9 % — ABNORMAL LOW (ref 36.0–46.0)
Hemoglobin: 8 g/dL — ABNORMAL LOW (ref 12.0–15.0)
Immature Granulocytes: 2 %
Lymphocytes Relative: 32 %
Lymphs Abs: 3.5 10*3/uL (ref 0.7–4.0)
MCH: 18.3 pg — ABNORMAL LOW (ref 26.0–34.0)
MCHC: 28.7 g/dL — ABNORMAL LOW (ref 30.0–36.0)
MCV: 63.7 fL — ABNORMAL LOW (ref 80.0–100.0)
Monocytes Absolute: 0.9 10*3/uL (ref 0.1–1.0)
Monocytes Relative: 8 %
Neutro Abs: 6.1 10*3/uL (ref 1.7–7.7)
Neutrophils Relative %: 55 %
Platelet Count: 505 10*3/uL — ABNORMAL HIGH (ref 150–400)
RBC: 4.38 MIL/uL (ref 3.87–5.11)
RDW: 18.2 % — ABNORMAL HIGH (ref 11.5–15.5)
WBC Count: 11 10*3/uL — ABNORMAL HIGH (ref 4.0–10.5)
nRBC: 0 % (ref 0.0–0.2)

## 2021-03-15 LAB — FERRITIN: Ferritin: <4 ng/mL — ABNORMAL LOW (ref 11–307)

## 2021-03-15 LAB — RETIC PANEL
Immature Retic Fract: 27 % — ABNORMAL HIGH (ref 2.3–15.9)
RBC.: 4.26 MIL/uL (ref 3.87–5.11)
Retic Count, Absolute: 81.4 10*3/uL (ref 19.0–186.0)
Retic Ct Pct: 1.9 % (ref 0.4–3.1)
Reticulocyte Hemoglobin: 17.7 pg — ABNORMAL LOW (ref >27.9)

## 2021-03-15 LAB — IRON AND TIBC
Iron: 15 ug/dL — ABNORMAL LOW (ref 41–142)
Saturation Ratios: 3 % — ABNORMAL LOW (ref 21–57)
TIBC: 440 ug/dL (ref 236–444)
UIBC: 425 ug/dL — ABNORMAL HIGH (ref 120–384)

## 2021-03-15 MED ORDER — AMOXICILLIN 875 MG PO TABS: 875.0000 mg | ORAL_TABLET | Freq: Two times a day (BID) | ORAL | 0 refills | Status: AC

## 2021-03-15 NOTE — Progress Notes (Signed)
[facility] [phone]   [phone]  INITIAL CONSULT NOTE  Patient Care Team: Carmel Sacramento, NP as PCP - General (Nurse Practitioner)  Hematological/Oncological History 1) 03/13/2021: Presented to ED for palpitations and shortness of breath.  -WBC 18.8 (H), Hgb 8.0 (L), MCV 63.3 (L), Plt 519 (H), Neut # 10.6 (H), Lymph # 6.4 (H) -CTA chest was negative for pulmonary embolus or other infectious process.   2) 03/15/2021: Establish care with Lauren Kaufmann PA-C  CHIEF COMPLAINTS/PURPOSE OF CONSULTATION:  "Anemia"  HISTORY OF PRESENTING ILLNESS:  Lauren Stafford 45 y.o. female without significant medical history presents to the clinic for evaluation for microcytic anemia. Patient is unaccompanied for this visit.   On exam today, Lauren Stafford reports persistent fatigue that does interfere with her daily activities. She notes taking several naps during the day while she is at work. She has progressive dyspnea on exertion. Patient has a good appetite without any dietary restrictions. She denies any nausea, vomiting or abdominal pain. Her bowel movements are regular. She has heavy, irregular menstrual cycles usually lasting 7-8 days. The heavy days are 3-4 days and she requires changing 3-4 tampons per hour. She has not recently seen her gynecologist in the last couple of years and is not currently on contraceptives. Patient has tried oral iron supplements in the past but stopped due to constipation and GI intolerance. She is currently trying OTC iron patch for the last two weeks. Patient has noted hair thinning and craving for ice chips. Over the last week, patient has progressive upper respiratory symptoms with post nasal drip, productive cough with yellow sputum and congestion. She denies any fevers, chills or night sweats. She has no other complaints. Rest of the 10 point ROS is below.   MEDICAL HISTORY:  History reviewed. No pertinent past medical  history.  SURGICAL HISTORY: Past Surgical History:  Procedure Laterality Date  . arm surgery     due to fracture  . CESAREAN SECTION  2013    SOCIAL HISTORY: Social History   Socioeconomic History  . Marital status: Married    Spouse name: Not on file  . Number of children: Not on file  . Years of education: Not on file  . Highest education level: Not on file  Occupational History  . Not on file  Tobacco Use  . Smoking status: Never Smoker  . Smokeless tobacco: Never Used  Substance and Sexual Activity  . Alcohol use: Not Currently  . Drug use: No  . Sexual activity: Not on file  Other Topics Concern  . Not on file  Social History Narrative  . Not on file   Social Determinants of Health   Financial Resource Strain: Not on file  Food Insecurity: Not on file  Transportation Needs: Not on file  Physical Activity: Not on file  Stress: Not on file  Social Connections: Not on file  Intimate Partner Violence: Not on file    FAMILY HISTORY: Family History  Problem Relation Age of Onset  . Skin cancer Brother     ALLERGIES:  has No Known Allergies.  MEDICATIONS:  Current Outpatient Medications  Medication Sig Dispense Refill  . valACYclovir (VALTREX) 500 MG tablet Take 1 tablet by mouth as needed.    . ondansetron (ZOFRAN ODT) 4 MG disintegrating tablet 4mg  ODT q4 hours prn nausea/vomit (Patient not taking: Reported on 03/15/2021) 10 tablet 0   No current facility-administered medications for this visit.    REVIEW OF SYSTEMS:  Constitutional: ( - ) fevers, ( - )  chills , ( - ) night sweats Eyes: ( + ) blurriness of vision, ( - ) double vision, ( - ) watery eyes Ears, nose, mouth, throat, and face: ( - ) mucositis, ( - ) sore throat Respiratory: ( + ) cough, ( + ) dyspnea, ( - ) wheezes Cardiovascular: ( + ) palpitation, ( - ) chest discomfort, ( - ) lower extremity swelling Gastrointestinal:  ( - ) nausea, ( - ) heartburn, ( - ) change in bowel habits Skin:  ( - ) abnormal skin rashes Lymphatics: ( - ) new lymphadenopathy, ( - ) easy bruising Neurological: ( - ) numbness, ( - ) tingling, ( - ) new weaknesses Behavioral/Psych: ( - ) mood change, ( - ) new changes  All other systems were reviewed with the patient and are negative.  PHYSICAL EXAMINATION: ECOG PERFORMANCE STATUS: 1 - Symptomatic but completely ambulatory  Vitals:   03/15/21 1115  BP: (!) 152/87  Pulse: 87  Resp: 18  Temp: 97.8 F (36.6 C)  SpO2: 99%   Filed Weights   03/15/21 1115  Weight: 235 lb 14.4 oz (107 kg)    GENERAL: well appearing female in NAD  SKIN: skin color, texture, turgor are normal, no rashes or significant lesions EYES: conjunctiva are pink and non-injected, sclera clear OROPHARYNX: no exudate, no erythema; lips, buccal mucosa, and tongue normal  NECK: supple, non-tender LYMPH:  no palpable lymphadenopathy in the cervical, axillary or supraclavicular lymph nodes.  LUNGS: clear to auscultation and percussion with normal breathing effort HEART: regular rate & rhythm and no murmurs and no lower extremity edema ABDOMEN: soft, non-tender, non-distended, normal bowel sounds Musculoskeletal: no cyanosis of digits and no clubbing  PSYCH: alert & oriented x 3, fluent speech NEURO: no focal motor/sensory deficits  LABORATORY DATA:  I have reviewed the data as listed CBC Latest Ref Rng & Units 03/15/2021 03/13/2021 09/03/2014  WBC 4.0 - 10.5 K/uL 11.0(H) 18.8(H) 12.0(H)  Hemoglobin 12.0 - 15.0 g/dL 8.0(L) 8.0(L) 11.6(L)  Hematocrit 36.0 - 46.0 % 27.9(L) 27.1(L) 35.6(L)  Platelets 150 - 400 K/uL 505(H) 519(H) 345    CMP Latest Ref Rng & Units 03/13/2021 09/03/2014  Glucose 70 - 99 mg/dL 466(Z) 96  BUN 6 - 20 mg/dL 13 12  Creatinine 9.93 - 1.00 mg/dL 5.70 1.77  Sodium 939 - 145 mmol/L 137 142  Potassium 3.5 - 5.1 mmol/L 3.4(L) 4.3  Chloride 98 - 111 mmol/L 103 103  CO2 22 - 32 mmol/L 25 27  Calcium 8.9 - 10.3 mg/dL 9.2 9.9  Total Protein 6.5 - 8.1 g/dL  7.3 7.8  Total Bilirubin 0.3 - 1.2 mg/dL 0.3 0.3(E)  Alkaline Phos 38 - 126 U/L 63 80  AST 15 - 41 U/L 19 20  ALT 0 - 44 U/L 31 33    RADIOGRAPHIC STUDIES: I have personally reviewed the radiological images as listed and agreed with the findings in the report. CT Angio Chest PE W and/or Wo Contrast  Result Date: 03/13/2021 CLINICAL DATA:  Shortness of breath EXAM: CT ANGIOGRAPHY CHEST WITH CONTRAST TECHNIQUE: Multidetector CT imaging of the chest was performed using the standard protocol during bolus administration of intravenous contrast. Multiplanar CT image reconstructions and MIPs were obtained to evaluate the vascular anatomy. CONTRAST:  76mL OMNIPAQUE IOHEXOL 350 MG/ML SOLN COMPARISON:  Chest x-ray 03/13/2021 FINDINGS: Cardiovascular: Contrast opacification of pulmonary arterial system is poor. No gross acute filling defect within the main or proximal  lobar arteries. Approaching nondiagnostic for segmental and distal vessels. Nonaneurysmal aorta. No dissection. No significant pericardial effusion Mediastinum/Nodes: Midline trachea. No thyroid mass. No suspicious nodes. Lungs/Pleura: Lungs are clear. No pleural effusion or pneumothorax. Upper Abdomen: No acute abnormality. Musculoskeletal: None Review of the MIP images confirms the above findings. IMPRESSION: 1. Poor contrast bolus for evaluation of PE. No gross central embolus is seen; essentially nondiagnostic study for segmental and distal emboli. 2. Clear lung fields Electronically Signed   By: Jasmine Pang M.D.   On: 03/13/2021 21:37   DG Chest Portable 1 View  Result Date: 03/13/2021 CLINICAL DATA:  Cough EXAM: PORTABLE CHEST 1 VIEW COMPARISON:  06/30/2006 FINDINGS: The heart size and mediastinal contours are within normal limits. Both lungs are clear. The visualized skeletal structures are unremarkable. Mild aortic atherosclerosis. IMPRESSION: No active disease. Electronically Signed   By: Jasmine Pang M.D.   On: 03/13/2021 20:35     ASSESSMENT & PLAN Vidalia Joeline Freer is a 46 y.o. female presenting to the clinic for microcytic anemia. Based on review of records and history/physical, the likely cause is iron deficiency anemia secondary to menorrhagia. Patient is unable to tolerate oral iron supplementation so I recommend IV venofer x 5 doses. Plan to proceed with baseline labs to check CBC, CMP, iron and TIBC, ferritin and retic panel.   #Iron deficiency anemia 2/2 menorrhagia: --CBC from 03/13/2021 revealed Hgb of 8.0 and MCV of 63.30 --Need baseline labs today with CBC w/ diff, CMP, iron and TIBC, ferritin and retic panel.  --Unable to tolerate oral iron so recommend IV venofer x 5 doses.  --Provided list of iron rich foods to incorporate into diet. --Need input from OB/GYN for heavy menstrual bleeding. Referral has been placed. --RTC 2 weeks after completion of IV iron infusions.   #Thrombocytosis: --Platelet count from 03/13/2021 was 519K. Likely reactive from severe iron deficiency anemia. Plan to recheck levels today.  --If there is persistent thrombocytosis once iron levels recover, consider MPN workup.   #Leukocytosis, neutrophil predominant: --Likely secondary to upper respiratory infection.  --Plan to recheck levels today.   #Upper respiratory infection: --Symptoms include post nasal drip, productive cough and congestion x 7 days --Will send 7 day course of amoxicillin 875 BID.  --Advised patient to follow up in clinic if symptoms don't improve with antibiotics.    Orders Placed This Encounter  Procedures  . CBC with Differential (Cancer Center Only)    Standing Status:   Future    Number of Occurrences:   1    Standing Expiration Date:   03/15/2022  . Iron and TIBC    Standing Status:   Future    Number of Occurrences:   1    Standing Expiration Date:   03/15/2022  . Ferritin    Standing Status:   Future    Number of Occurrences:   1    Standing Expiration Date:   03/15/2022  . Retic Panel     Standing Status:   Future    Number of Occurrences:   1    Standing Expiration Date:   03/15/2022    All questions were answered. The patient knows to call the clinic with any problems, questions or concerns.  A total of more than 60 minutes were spent on this encounter and over half of that time was spent on counseling and coordination of care as outlined above.    Lauren Kaufmann, PA-C Department of Hematology/Oncology [facility]  Phone: [phone]  Patient was seen with Dr. Leonides Schanzorsey.   I have read the above note and personally examined the patient. I agree with the assessment and plan as noted above.  Briefly Mrs. Majer is a 45 year old female with medical history significant for iron deficiency anemia and leukocytosis.  Etiology of the patient's leukocytosis is most likely her current sinus infection.  We will treat her with a course of antibiotics in order to assure this resolves.  In terms of her iron deficiency anemia the patient has a history of heavy menstrual cycles.  In order to help her catch up would recommend administration of IV iron with close interval follow-up in order to assure this has corrected her hematological abnormalities.   Ulysees BarnsJohn T. Dorsey, MD Department of Hematology/Oncology [facility] Phone: [phone] Pager: [phone] Email: [email or web address]

## 2021-03-16 LAB — CULTURE, BLOOD (ROUTINE X 2): Special Requests: ADEQUATE

## 2021-03-17 LAB — CULTURE, BLOOD (ROUTINE X 2)

## 2021-03-18 LAB — CULTURE, BLOOD (ROUTINE X 2)
Culture: NO GROWTH
Special Requests: ADEQUATE

## 2021-03-28 ENCOUNTER — Telehealth: Payer: Self-pay | Admitting: Physician Assistant

## 2021-03-29 ENCOUNTER — Ambulatory Visit: Payer: Commercial Managed Care - PPO

## 2021-04-05 ENCOUNTER — Inpatient Hospital Stay: Payer: Commercial Managed Care - PPO

## 2021-04-05 ENCOUNTER — Other Ambulatory Visit: Payer: Self-pay

## 2021-04-05 VITALS — BP 139/72 | HR 85 | Temp 98.6°F | Resp 17

## 2021-04-05 DIAGNOSIS — D500 Iron deficiency anemia secondary to blood loss (chronic): Secondary | ICD-10-CM

## 2021-04-05 MED ORDER — SODIUM CHLORIDE 0.9 % IV SOLN
Freq: Once | INTRAVENOUS | Status: AC
  Filled 2021-04-05: qty 250

## 2021-04-05 MED ORDER — SODIUM CHLORIDE 0.9 % IV SOLN
200.0000 mg | Freq: Once | INTRAVENOUS | Status: AC
  Administered 2021-04-05: 200 mg via INTRAVENOUS
  Filled 2021-04-05: qty 200

## 2021-04-05 NOTE — Patient Instructions (Signed)

## 2021-04-12 ENCOUNTER — Other Ambulatory Visit: Payer: Self-pay

## 2021-04-12 ENCOUNTER — Inpatient Hospital Stay: Payer: Commercial Managed Care - PPO | Attending: Physician Assistant

## 2021-04-12 VITALS — BP 150/80 | HR 84 | Temp 98.4°F | Resp 18

## 2021-04-12 DIAGNOSIS — N920 Excessive and frequent menstruation with regular cycle: Secondary | ICD-10-CM | POA: Insufficient documentation

## 2021-04-12 DIAGNOSIS — D500 Iron deficiency anemia secondary to blood loss (chronic): Secondary | ICD-10-CM | POA: Diagnosis not present

## 2021-04-12 MED ORDER — SODIUM CHLORIDE 0.9 % IV SOLN
Freq: Once | INTRAVENOUS | Status: AC
  Filled 2021-04-12: qty 250

## 2021-04-12 MED ORDER — SODIUM CHLORIDE 0.9 % IV SOLN
200.0000 mg | Freq: Once | INTRAVENOUS | Status: AC
  Administered 2021-04-12: 200 mg via INTRAVENOUS
  Filled 2021-04-12: qty 200

## 2021-04-12 NOTE — Patient Instructions (Signed)

## 2021-04-19 ENCOUNTER — Inpatient Hospital Stay: Payer: Commercial Managed Care - PPO

## 2021-04-19 ENCOUNTER — Other Ambulatory Visit: Payer: Self-pay

## 2021-04-19 VITALS — BP 129/79 | HR 70 | Temp 97.8°F | Resp 18 | Wt 235.5 lb

## 2021-04-19 DIAGNOSIS — D500 Iron deficiency anemia secondary to blood loss (chronic): Secondary | ICD-10-CM | POA: Diagnosis not present

## 2021-04-19 MED ORDER — SODIUM CHLORIDE 0.9 % IV SOLN
200.0000 mg | Freq: Once | INTRAVENOUS | Status: AC
  Administered 2021-04-19: 200 mg via INTRAVENOUS
  Filled 2021-04-19: qty 200

## 2021-04-19 MED ORDER — SODIUM CHLORIDE 0.9 % IV SOLN
Freq: Once | INTRAVENOUS | Status: AC
  Filled 2021-04-19: qty 250

## 2021-04-19 NOTE — Patient Instructions (Signed)

## 2021-04-26 ENCOUNTER — Inpatient Hospital Stay: Payer: Commercial Managed Care - PPO

## 2021-04-26 ENCOUNTER — Other Ambulatory Visit: Payer: Self-pay

## 2021-04-26 VITALS — BP 133/80 | HR 71 | Temp 98.2°F | Resp 16

## 2021-04-26 DIAGNOSIS — D500 Iron deficiency anemia secondary to blood loss (chronic): Secondary | ICD-10-CM

## 2021-04-26 MED ORDER — SODIUM CHLORIDE 0.9 % IV SOLN
200.0000 mg | Freq: Once | INTRAVENOUS | Status: AC
  Administered 2021-04-26: 200 mg via INTRAVENOUS
  Filled 2021-04-26: qty 200

## 2021-04-26 MED ORDER — SODIUM CHLORIDE 0.9 % IV SOLN
Freq: Once | INTRAVENOUS | Status: AC
  Filled 2021-04-26: qty 250

## 2021-04-26 NOTE — Progress Notes (Signed)
Patient declined 30 minute post observation.  No s/s or c/o distress or discomfort.  VSS. 

## 2021-04-26 NOTE — Patient Instructions (Signed)

## 2021-05-03 ENCOUNTER — Inpatient Hospital Stay: Payer: Commercial Managed Care - PPO

## 2021-05-03 ENCOUNTER — Other Ambulatory Visit: Payer: Self-pay

## 2021-05-03 VITALS — BP 134/90 | HR 68 | Temp 98.5°F | Resp 16

## 2021-05-03 DIAGNOSIS — D500 Iron deficiency anemia secondary to blood loss (chronic): Secondary | ICD-10-CM

## 2021-05-03 MED ORDER — SODIUM CHLORIDE 0.9 % IV SOLN
Freq: Once | INTRAVENOUS | Status: AC
Start: 2021-05-03 — End: 2021-05-03
  Filled 2021-05-03: qty 250

## 2021-05-03 MED ORDER — SODIUM CHLORIDE 0.9 % IV SOLN
200.0000 mg | Freq: Once | INTRAVENOUS | Status: AC
  Administered 2021-05-03: 200 mg via INTRAVENOUS
  Filled 2021-05-03: qty 200

## 2021-05-03 NOTE — Progress Notes (Signed)
Pt declined to stay for 30 min after iron infusion, VSS throughout. Pt discharged in stable condition instructed to call with any concerns.

## 2021-05-03 NOTE — Patient Instructions (Signed)

## 2021-05-10 ENCOUNTER — Other Ambulatory Visit: Payer: Commercial Managed Care - PPO

## 2021-05-10 ENCOUNTER — Ambulatory Visit: Payer: Commercial Managed Care - PPO | Admitting: Physician Assistant

## 2021-05-16 ENCOUNTER — Other Ambulatory Visit: Payer: Self-pay | Admitting: Physician Assistant

## 2021-05-16 DIAGNOSIS — D500 Iron deficiency anemia secondary to blood loss (chronic): Secondary | ICD-10-CM

## 2021-05-17 ENCOUNTER — Inpatient Hospital Stay: Payer: Commercial Managed Care - PPO | Admitting: Physician Assistant

## 2021-05-17 ENCOUNTER — Telehealth: Payer: Self-pay | Admitting: Physician Assistant

## 2021-05-17 ENCOUNTER — Inpatient Hospital Stay: Payer: Commercial Managed Care - PPO

## 2021-05-17 NOTE — Telephone Encounter (Signed)
R/s appts per 7/8 sch msg. Pt aware.  

## 2021-06-03 ENCOUNTER — Emergency Department (HOSPITAL_BASED_OUTPATIENT_CLINIC_OR_DEPARTMENT_OTHER): Payer: Commercial Managed Care - PPO

## 2021-06-03 ENCOUNTER — Encounter: Payer: Self-pay | Admitting: Physician Assistant

## 2021-06-03 ENCOUNTER — Other Ambulatory Visit (HOSPITAL_BASED_OUTPATIENT_CLINIC_OR_DEPARTMENT_OTHER): Payer: Self-pay

## 2021-06-03 ENCOUNTER — Encounter (HOSPITAL_BASED_OUTPATIENT_CLINIC_OR_DEPARTMENT_OTHER): Payer: Self-pay | Admitting: Emergency Medicine

## 2021-06-03 ENCOUNTER — Other Ambulatory Visit: Payer: Self-pay

## 2021-06-03 ENCOUNTER — Emergency Department (HOSPITAL_BASED_OUTPATIENT_CLINIC_OR_DEPARTMENT_OTHER)
Admission: EM | Admit: 2021-06-03 | Discharge: 2021-06-03 | Disposition: A | Discharge status: Home / Self Care | Payer: Commercial Managed Care - PPO | Attending: Emergency Medicine | Admitting: Emergency Medicine

## 2021-06-03 DIAGNOSIS — R1013 Epigastric pain: Secondary | ICD-10-CM | POA: Diagnosis present

## 2021-06-03 DIAGNOSIS — R1011 Right upper quadrant pain: Secondary | ICD-10-CM

## 2021-06-03 DIAGNOSIS — K8020 Calculus of gallbladder without cholecystitis without obstruction: Secondary | ICD-10-CM

## 2021-06-03 DIAGNOSIS — I7101 Dissection of thoracic aorta: Secondary | ICD-10-CM | POA: Diagnosis not present

## 2021-06-03 DIAGNOSIS — R112 Nausea with vomiting, unspecified: Secondary | ICD-10-CM

## 2021-06-03 DIAGNOSIS — Z8719 Personal history of other diseases of the digestive system: Secondary | ICD-10-CM

## 2021-06-03 HISTORY — DX: Personal history of other diseases of the digestive system: Z87.19

## 2021-06-03 HISTORY — DX: Calculus of gallbladder without cholecystitis without obstruction: K80.20

## 2021-06-03 LAB — CBC
HCT: 39.4 % (ref 36.0–46.0)
Hemoglobin: 12.2 g/dL (ref 12.0–15.0)
MCH: 22.8 pg — ABNORMAL LOW (ref 26.0–34.0)
MCHC: 31 g/dL (ref 30.0–36.0)
MCV: 73.5 fL — ABNORMAL LOW (ref 80.0–100.0)
Platelets: 338 10*3/uL (ref 150–400)
RBC: 5.36 MIL/uL — ABNORMAL HIGH (ref 3.87–5.11)
RDW: 26.1 % — ABNORMAL HIGH (ref 11.5–15.5)
WBC: 12.1 10*3/uL — ABNORMAL HIGH (ref 4.0–10.5)
nRBC: 0 % (ref 0.0–0.2)

## 2021-06-03 LAB — COMPREHENSIVE METABOLIC PANEL
ALT: 39 U/L (ref 0–44)
AST: 22 U/L (ref 15–41)
Albumin: 3.9 g/dL (ref 3.5–5.0)
Alkaline Phosphatase: 68 U/L (ref 38–126)
Anion gap: 9 (ref 5–15)
BUN: 9 mg/dL (ref 6–20)
CO2: 24 mmol/L (ref 22–32)
Calcium: 9.4 mg/dL (ref 8.9–10.3)
Chloride: 106 mmol/L (ref 98–111)
Creatinine, Ser: 0.65 mg/dL (ref 0.44–1.00)
GFR, Estimated: >60 mL/min (ref >60)
Glucose, Bld: 133 mg/dL — ABNORMAL HIGH (ref 70–99)
Potassium: 3.9 mmol/L (ref 3.5–5.1)
Sodium: 139 mmol/L (ref 135–145)
Total Bilirubin: 0.1 mg/dL — ABNORMAL LOW (ref 0.3–1.2)
Total Protein: 8.1 g/dL (ref 6.5–8.1)

## 2021-06-03 LAB — URINALYSIS, ROUTINE W REFLEX MICROSCOPIC
Bilirubin Urine: NEGATIVE
Glucose, UA: NEGATIVE mg/dL
Ketones, ur: 40 mg/dL — AB
Leukocytes,Ua: NEGATIVE
Nitrite: NEGATIVE
Protein, ur: 30 mg/dL — AB
Specific Gravity, Urine: 1.025 (ref 1.005–1.030)
pH: 6 (ref 5.0–8.0)

## 2021-06-03 LAB — PREGNANCY, URINE: Preg Test, Ur: NEGATIVE

## 2021-06-03 LAB — URINALYSIS, MICROSCOPIC (REFLEX)

## 2021-06-03 LAB — LIPASE, BLOOD: Lipase: 28 U/L (ref 11–51)

## 2021-06-03 MED ORDER — OXYCODONE-ACETAMINOPHEN 5-325 MG PO TABS
1.0000 | ORAL_TABLET | Freq: Four times a day (QID) | ORAL | 0 refills | Status: DC | PRN
  Filled 2021-06-03: qty 15, 4d supply, fill #0

## 2021-06-03 MED ORDER — IOHEXOL 350 MG/ML SOLN
100.0000 mL | Freq: Once | INTRAVENOUS | Status: AC | PRN
  Administered 2021-06-03: 100 mL via INTRAVENOUS

## 2021-06-03 MED ORDER — OXYCODONE-ACETAMINOPHEN 5-325 MG PO TABS
1.0000 | ORAL_TABLET | Freq: Once | ORAL | Status: AC
Start: 2021-06-03 — End: 2021-06-03
  Administered 2021-06-03: 1 via ORAL
  Filled 2021-06-03: qty 1

## 2021-06-03 MED ORDER — ONDANSETRON HCL 4 MG PO TABS
4.0000 mg | ORAL_TABLET | Freq: Three times a day (TID) | ORAL | 0 refills | Status: DC | PRN
  Filled 2021-06-03: qty 12, 4d supply, fill #0

## 2021-06-03 NOTE — ED Notes (Signed)
Pt given sprite and crackers  

## 2021-06-03 NOTE — ED Notes (Signed)
Patient passed PO challenge.

## 2021-06-03 NOTE — ED Triage Notes (Signed)
Pt presents with epigastric pain that started ~1800-1900. Pt states pain is epigastric and radiates to her back. Reports 1 episode of vomiting after arriving in ED. Pt is tearful and fidgety. Denies other sx.

## 2021-06-03 NOTE — ED Provider Notes (Signed)
[facility] EMERGENCY DEPARTMENT Provider Note   CSN: [id number] Arrival date & time: 06/03/21  0404     History Chief Complaint  Patient presents with   Abdominal Pain    Lauren Stafford is a 45 y.o. female.  The history is provided by the patient and medical records. No language interpreter was used.  Abdominal Pain Pain location:  Epigastric and RUQ Pain quality: sharp and stabbing   Pain radiates to:  Back and chest Pain severity:  Severe Onset quality:  Sudden Duration:  1 day Timing:  Constant Progression:  Improving Chronicity:  New Context: not trauma   Relieved by:  Nothing Worsened by:  Nothing Ineffective treatments:  None tried Associated symptoms: nausea and vomiting   Associated symptoms: no chest pain, no chills, no constipation, no cough, no diarrhea, no dysuria, no fatigue, no fever and no shortness of breath       History reviewed. No pertinent past medical history.  Patient Active Problem List   Diagnosis Date Noted   Iron deficiency anemia due to chronic blood loss 03/15/2021   Thrombocytosis 03/15/2021   Leukocytosis 03/15/2021   Upper respiratory infection 03/15/2021    Past Surgical History:  Procedure Laterality Date   arm surgery     due to fracture   CESAREAN SECTION  2013     OB History   No obstetric history on file.     Family History  Problem Relation Age of Onset   Skin cancer Brother     Social History   Tobacco Use   Smoking status: Never   Smokeless tobacco: Never  Substance Use Topics   Alcohol use: Not Currently   Drug use: No    Home Medications Prior to Admission medications   Medication Sig Start Date End Date Taking? Authorizing Provider  ondansetron (ZOFRAN ODT) 4 MG disintegrating tablet 4mg  ODT q4 hours prn nausea/vomit Patient not taking: Reported on 03/15/2021 09/04/14   Blane OharaZavitz, Joshua, MD  valACYclovir (VALTREX) 500 MG tablet Take 1 tablet by mouth as needed. 05/09/16   [provider]    Allergies    Patient has no known allergies.  Review of Systems   Review of Systems  Constitutional:  Negative for chills, fatigue and fever.  HENT:  Negative for congestion.   Eyes:  Negative for visual disturbance.  Respiratory:  Negative for cough, chest tightness and shortness of breath.   Cardiovascular:  Negative for chest pain, palpitations and leg swelling.  Gastrointestinal:  Positive for abdominal distention (sensation of distension), abdominal pain, nausea and vomiting. Negative for constipation and diarrhea.  Genitourinary:  Negative for dysuria, flank pain, frequency and pelvic pain.  Musculoskeletal:  Positive for back pain. Negative for neck pain and neck stiffness.  Skin:  Negative for rash and wound.  Neurological:  Negative for light-headedness and headaches.  Psychiatric/Behavioral:  Negative for agitation and confusion.   All other systems reviewed and are negative.  Physical Exam Updated Vital Signs BP (!) 158/86 (BP Location: Right Arm)   Pulse 75   Temp 99 F (37.2 C)   Resp 18   Ht 5\' 8"  (1.727 m)   Wt 106.1 kg   SpO2 100%   BMI 35.58 kg/m   Physical Exam Vitals and nursing note reviewed.  Constitutional:      General: She is not in acute distress.    Appearance: She is well-developed. She is not ill-appearing, toxic-appearing or diaphoretic.  HENT:     Head: Normocephalic  and atraumatic.  Eyes:     General: No scleral icterus.    Conjunctiva/sclera: Conjunctivae normal.  Cardiovascular:     Rate and Rhythm: Normal rate and regular rhythm.     Heart sounds: No murmur heard. Pulmonary:     Effort: Pulmonary effort is normal. No respiratory distress.     Breath sounds: Normal breath sounds.  Abdominal:     General: Abdomen is flat. Bowel sounds are normal. There is no distension.     Palpations: Abdomen is soft.     Tenderness: There is abdominal tenderness in the right upper quadrant and epigastric area. There is no right  CVA tenderness, left CVA tenderness, guarding or rebound.  Musculoskeletal:     Cervical back: Neck supple.  Skin:    General: Skin is warm and dry.     Capillary Refill: Capillary refill takes less than 2 seconds.  Neurological:     General: No focal deficit present.     Mental Status: She is alert.  Psychiatric:        Mood and Affect: Mood normal.    ED Results / Procedures / Treatments   Labs (all labs ordered are listed, but only abnormal results are displayed) Labs Reviewed  COMPREHENSIVE METABOLIC PANEL - Abnormal; Notable for the following components:      Result Value   Glucose, Bld 133 (*)    Total Bilirubin 0.1 (*)    All other components within normal limits  CBC - Abnormal; Notable for the following components:   WBC 12.1 (*)    RBC 5.36 (*)    MCV 73.5 (*)    MCH 22.8 (*)    RDW 26.1 (*)    All other components within normal limits  URINALYSIS, ROUTINE W REFLEX MICROSCOPIC - Abnormal; Notable for the following components:   Hgb urine dipstick MODERATE (*)    Ketones, ur 40 (*)    Protein, ur 30 (*)    All other components within normal limits  URINALYSIS, MICROSCOPIC (REFLEX) - Abnormal; Notable for the following components:   Bacteria, UA MANY (*)    Non Squamous Epithelial PRESENT (*)    All other components within normal limits  URINE CULTURE  LIPASE, BLOOD  PREGNANCY, URINE    EKG EKG Interpretation  Date/Time:  Monday June 03 2021 04:40:47 EDT Ventricular Rate:  84 PR Interval:  130 QRS Duration: 82 QT Interval:  392 QTC Calculation: 463 R Axis:   64 Text Interpretation: Normal sinus rhythm Normal ECG When compared to prior, slower rate. No STEMI Confirmed by Theda Belfast ([id number]) on 06/03/2021 8:01:24 AM  Radiology CT Angio Chest/Abd/Pel for Dissection W and/or Wo Contrast  Result Date: 06/03/2021 CLINICAL DATA:  Chest and abdominal pain. EXAM: CT ANGIOGRAPHY CHEST, ABDOMEN AND PELVIS TECHNIQUE: Non-contrast CT of the chest was initially  obtained. Multidetector CT imaging through the chest, abdomen and pelvis was performed using the standard protocol during bolus administration of intravenous contrast. Multiplanar reconstructed images and MIPs were obtained and reviewed to evaluate the vascular anatomy. CONTRAST:  OMNIPAQUE IOHEXOL 350 MG/ML SOLN COMPARISON:  Mar 13, 2021.  September 08, 2014. FINDINGS: CTA CHEST FINDINGS Cardiovascular: Preferential opacification of the thoracic aorta. No evidence of thoracic aortic aneurysm or dissection. Normal heart size. No pericardial effusion. Mediastinum/Nodes: No enlarged mediastinal, hilar, or axillary lymph nodes. Thyroid gland, trachea, and esophagus demonstrate no significant findings. Lungs/Pleura: No pneumothorax or pleural effusion is noted. Stable 6 mm subpleural nodule is noted laterally in the left  lower lobe best seen on image number 50 of series 8. This can be considered benign at this point with no further follow-up required. Lungs are otherwise clear. Musculoskeletal: No chest wall abnormality. No acute or significant osseous findings. Review of the MIP images confirms the above findings. CTA ABDOMEN AND PELVIS FINDINGS VASCULAR Aorta: Normal caliber aorta without aneurysm, dissection, vasculitis or significant stenosis. Celiac: Patent without evidence of aneurysm, dissection, vasculitis or significant stenosis. SMA: Patent without evidence of aneurysm, dissection, vasculitis or significant stenosis. Renals: Both renal arteries are patent without evidence of aneurysm, dissection, vasculitis, fibromuscular dysplasia or significant stenosis. IMA: Patent without evidence of aneurysm, dissection, vasculitis or significant stenosis. Inflow: Patent without evidence of aneurysm, dissection, vasculitis or significant stenosis. Veins: No obvious venous abnormality within the limitations of this arterial phase study. Review of the MIP images confirms the above findings. NON-VASCULAR Hepatobiliary: No  gallstones or biliary dilatation is noted. Hepatic steatosis is noted. Pancreas: Unremarkable. No pancreatic ductal dilatation or surrounding inflammatory changes. Spleen: Normal in size without focal abnormality. Adrenals/Urinary Tract: Adrenal glands appear normal. Small nonobstructive left renal calculus is noted. No hydronephrosis or renal obstruction is noted. Urinary bladder is unremarkable. Stomach/Bowel: Stomach is within normal limits. Appendix appears normal. No evidence of bowel wall thickening, distention, or inflammatory changes. Lymphatic: No significant adenopathy is noted. Reproductive: Uterus and bilateral adnexa are unremarkable. Other: No abdominal wall hernia or abnormality. No abdominopelvic ascites. Musculoskeletal: No acute or significant osseous findings. Review of the MIP images confirms the above findings. IMPRESSION: No evidence of thoracic or abdominal aortic dissection or aneurysm. Hepatic steatosis. Small nonobstructive left renal calculus. Electronically Signed   By: Lupita Raider M.D.   On: 06/03/2021 08:55   US Abdomen Limited RUQ (LIVER/GB)  Result Date: 06/03/2021 CLINICAL DATA:  Right upper quadrant pain EXAM: ULTRASOUND ABDOMEN LIMITED RIGHT UPPER QUADRANT COMPARISON:  Abdominal CT 09/08/2014 FINDINGS: Gallbladder: Small shadowing gallstones. No gallbladder wall thickening or focal tenderness. Common bile duct: Diameter: 4-5 mm.  Where visualized, no filling defect. Liver: 13 x 15 x 2 cm hyperechoic area in the left lobe liver. A low-density in this area subtly seen retrospectively on a noncontrast CT from 2015 and could be either hemangioma or focal fat deposition. Portal vein is patent on color Doppler imaging with normal direction of blood flow towards the liver. IMPRESSION: Cholelithiasis without signs of acute cholecystitis. Electronically Signed   By: Marnee Spring M.D.   On: 06/03/2021 10:53    Procedures Procedures   Medications Ordered in ED Medications   oxyCODONE-acetaminophen (PERCOCET/ROXICET) 5-325 MG per tablet 1 tablet (1 tablet Oral Given 06/03/21 0445)  iohexol (OMNIPAQUE) 350 MG/ML injection 100 mL (100 mLs Intravenous Contrast Given 06/03/21 0825)    ED Course  I have reviewed the triage vital signs and the nursing notes.  Pertinent labs & imaging results that were available during my care of the patient were reviewed by me and considered in my medical decision making (see chart for details).    MDM Rules/Calculators/A&P                           Lauren Stafford is a 45 y.o. female with a past medical history significant for anemia and previous kidney stones who presents with severe epigastric abdominal pain radiating straight through to her back and towards her lower chest.  Patient reports the symptoms began yesterday afternoon and progressively worsened overnight.  She reports it was a  bulging and stabbing pain in her epigastric area and central abdomen radiating towards her lower chest and going straight through like a knife to her back.  She does report her blood pressure has been more elevated.  She reports nausea and vomiting with the pain being 10 out of 10 in severity.  She reports it did not go towards her flanks like when she had kidney stones and reports this feels very different.  She reports no current urinary changes and denies any constipation or diarrhea.  She denies trauma.  She reports it is now a 4 out of 10 in severity.  On exam, epigastric and right upper quadrant is tender to palpation.  Lower abdomen is nontender.  She does have intact sensation and strength in extremities and had clear breath sounds.  Chest was nontender.  Did not appreciate a murmur.  Back was nontender.  Clinically I am primarily concerned that this is her gallbladder versus a kidney stone however with her report that it was a knifelike pain in her central epigastric abdomen going straight to her back and her blood pressure was around  160 on arrival, I do feel need to rule out dissection.  We will get a CT dissection study which will also look at her gallbladder and for kidney stones.  Her labs in triage did show blood in her urine which is concerning and speaks to more of a  kidney stone as the cause.  If her CT is complete reassuring, would likely do a right upper quadrant ultrasound to further evaluate.  Anticipate reassessment after work-up.  She reports does not want medicine at this time as symptoms have slightly improved.   CT scan was overall reassuring however he did have a kidney stone in the left kidney.  No evidence of aortic abnormality today.  Her gallbladder did not show evidence of acute cholecystitis but clinically as her symptoms are in the right upper quadrant and epigastric area I am still concerned about  gallstones that may have been missed on CT.  We will get right upper quadrant ultrasound to confirm there is no gallbladder abnormality.  Her ultrasound does show evidence of gallstones but no acute cholecystitis.  Suspect intermittent symptomatic cholelithiasis as the cause of her symptoms today over kidney stones.  Patient will follow-up with general surgery and will have a p.o. challenge now.  If she is able to eat and drink, we will send her home with pain medicine nausea medicine to follow-up with general surgery and PCP.  Patient is agreeable this plan.  Anticipate discharge after p.o. challenge.  12:37 PM Patient passed p.o. challenge and is feeling better.  We will give her prescription for pain medicine, nausea medicine, and give her number to call to follow-up with outpatient general surgery for likely symptomatic cholelithiasis.  She understands return precautions and follow-up instructions and was discharged in good condition.   Final Clinical Impression(s) / ED Diagnoses Final diagnoses:  RUQ abdominal pain  Symptomatic cholelithiasis  Gallstones  Non-intractable vomiting with nausea,  unspecified vomiting type    Rx / DC Orders ED Discharge Orders          Ordered    oxyCODONE-acetaminophen (PERCOCET/ROXICET) 5-325 MG tablet  Every 6 hours PRN        06/03/21 1239    ondansetron (ZOFRAN) 4 MG tablet  Every 8 hours PRN        06/03/21 1239            Clinical  Impression: 1. Symptomatic cholelithiasis   2. RUQ abdominal pain   3. Gallstones   4. Non-intractable vomiting with nausea, unspecified vomiting type     Disposition: Discharge  Condition: Good  I have discussed the results, Dx and Tx plan with the pt(& family if present). He/she/they expressed understanding and agree(s) with the plan. Discharge instructions discussed at great length. Strict return precautions discussed and pt &/or family have verbalized understanding of the instructions. No further questions at time of discharge.    New Prescriptions   ONDANSETRON (ZOFRAN) 4 MG TABLET    Take 1 tablet (4 mg total) by mouth every 8 (eight) hours as needed for nausea or vomiting.   OXYCODONE-ACETAMINOPHEN (PERCOCET/ROXICET) 5-325 MG TABLET    Take 1 tablet by mouth every 6 (six) hours as needed for severe pain.    Follow Up: Surgery, [facility] [address] [place] [address] [phone]     [facility] [address] [id number] mc [address] [place] [address] [phone]    Carmel Sacramento, NP [address] [place] [address] [phone]        Tarsha Blando, Canary Brim, MD 06/03/21 [phone]

## 2021-06-03 NOTE — Discharge Instructions (Addendum)
Your history, exam, work-up today are consistent with likely symptomatic cholelithiasis or pain from intermittent gallstones.  There is no evidence of acute cholecystitis which would be more of a surgical emergency.  We also saw blood in your urine which may be related to recently passed kidney stones.  The other work-up is reassuring as we discussed.  Please rest and stay hydrated and use the pain medicine and nausea medicine to help with symptoms.  Please follow-up with outpatient general surgery to discuss further gallbladder management.  If any symptoms change or worsen acutely, please return to the nearest emergency department for reevaluation.

## 2021-06-04 LAB — URINE CULTURE: Culture: <10000 — AB

## 2021-06-06 ENCOUNTER — Other Ambulatory Visit: Payer: Self-pay | Admitting: Physician Assistant

## 2021-06-07 ENCOUNTER — Inpatient Hospital Stay: Payer: Commercial Managed Care - PPO | Admitting: Physician Assistant

## 2021-06-07 ENCOUNTER — Inpatient Hospital Stay: Payer: Commercial Managed Care - PPO | Attending: Physician Assistant

## 2021-06-11 ENCOUNTER — Telehealth: Payer: Self-pay | Admitting: Physician Assistant

## 2021-06-11 ENCOUNTER — Telehealth: Payer: Self-pay | Admitting: *Deleted

## 2021-06-11 NOTE — Telephone Encounter (Signed)
Returned PC to patient, no answer, left VM - she called the office asking about a missed appointment, was unsure when appointment was.  Informed her she missed appointment on 06/07/21 & that scheduling will be contacting her to set up new appointment.  Scheduling message sent.

## 2021-06-11 NOTE — Telephone Encounter (Signed)
Scheduled appts per 8/2 sch msg. Called pt, no answer. Left msg with appts date and times.  

## 2021-06-19 ENCOUNTER — Inpatient Hospital Stay: Payer: Commercial Managed Care - PPO | Admitting: Physician Assistant

## 2021-06-19 ENCOUNTER — Inpatient Hospital Stay: Payer: Commercial Managed Care - PPO

## 2021-06-20 ENCOUNTER — Telehealth: Payer: Self-pay | Admitting: Physician Assistant

## 2021-06-20 NOTE — Telephone Encounter (Signed)
R/s appts per 8/10 sch msg. Called pt, no answer. Left msg with appts date and times.  

## 2021-07-02 ENCOUNTER — Other Ambulatory Visit: Payer: Self-pay | Admitting: Physician Assistant

## 2021-07-03 ENCOUNTER — Inpatient Hospital Stay: Payer: Commercial Managed Care - PPO

## 2021-07-03 ENCOUNTER — Inpatient Hospital Stay: Payer: Commercial Managed Care - PPO | Attending: Physician Assistant | Admitting: Physician Assistant

## 2021-09-02 ENCOUNTER — Other Ambulatory Visit: Payer: Self-pay

## 2021-09-02 ENCOUNTER — Encounter (HOSPITAL_BASED_OUTPATIENT_CLINIC_OR_DEPARTMENT_OTHER): Payer: Self-pay | Admitting: Obstetrics and Gynecology

## 2021-09-02 DIAGNOSIS — R030 Elevated blood-pressure reading, without diagnosis of hypertension: Secondary | ICD-10-CM

## 2021-09-02 HISTORY — DX: Elevated blood-pressure reading, without diagnosis of hypertension: R03.0

## 2021-09-02 NOTE — Progress Notes (Addendum)
Spoke w/ via phone for pre-op interview---pt Lab needs dos----urine preg    per anesthesia surgery orders pending           Lab results------ekh 06-03-2021 chart/epic, chest xray 1 view 03-13-2021 chart/epic, chest ct 06-03-2021 chart/epic, lov oncology irene thayil pa 03-15-2021 epic ( anemia) COVID test -----patient states asymptomatic no test needed Arrive at -------700 am 09-06-2021 NPO after MN NO Solid Food.  Clear liquids from MN until---600 am Med rec completed Medications to take morning of surgery -----junel Diabetic medication -----n/a Patient instructed no nail polish to be worn day of surgery Patient instructed to bring photo id and insurance card day of surgery Patient aware to have Driver (ride ) / caregiver    for 24 hours after surgery  daughter Lauren Stafford(age 69), caregiver spouse Lauren Stafford Patient Special Instructions -----none Pre-Op special Istructions -----note to dr [employer] epic ib Patient verbalized understanding of instructions that were given at this phone interview. Patient denies shortness of breath, chest pain, fever, cough at this phone interview.

## 2021-09-05 ENCOUNTER — Other Ambulatory Visit: Payer: Self-pay | Admitting: Obstetrics and Gynecology

## 2021-09-06 ENCOUNTER — Other Ambulatory Visit: Payer: Self-pay

## 2021-09-06 ENCOUNTER — Encounter (HOSPITAL_BASED_OUTPATIENT_CLINIC_OR_DEPARTMENT_OTHER): Payer: Self-pay | Admitting: Obstetrics and Gynecology

## 2021-09-06 ENCOUNTER — Encounter (HOSPITAL_BASED_OUTPATIENT_CLINIC_OR_DEPARTMENT_OTHER)
Admission: RE | Disposition: A | Discharge status: Home / Self Care | Payer: Self-pay | Source: Home / Self Care | Attending: Obstetrics and Gynecology

## 2021-09-06 ENCOUNTER — Ambulatory Visit (HOSPITAL_BASED_OUTPATIENT_CLINIC_OR_DEPARTMENT_OTHER): Payer: Commercial Managed Care - PPO | Admitting: Anesthesiology

## 2021-09-06 ENCOUNTER — Ambulatory Visit (HOSPITAL_BASED_OUTPATIENT_CLINIC_OR_DEPARTMENT_OTHER)
Admission: RE | Admit: 2021-09-06 | Discharge: 2021-09-06 | Disposition: A | Discharge status: Home / Self Care | Payer: Commercial Managed Care - PPO | Attending: Obstetrics and Gynecology | Admitting: Obstetrics and Gynecology

## 2021-09-06 DIAGNOSIS — N921 Excessive and frequent menstruation with irregular cycle: Secondary | ICD-10-CM | POA: Insufficient documentation

## 2021-09-06 HISTORY — DX: Personal history of other mental and behavioral disorders: Z86.59

## 2021-09-06 HISTORY — DX: Other fatigue: R53.83

## 2021-09-06 HISTORY — DX: Bipolar disorder, unspecified: F31.9

## 2021-09-06 HISTORY — PX: DILATATION & CURETTAGE/HYSTEROSCOPY WITH MYOSURE: SHX6511

## 2021-09-06 HISTORY — DX: Prediabetes: R73.03

## 2021-09-06 HISTORY — DX: Anemia, unspecified: D64.9

## 2021-09-06 HISTORY — DX: Personal history of urinary calculi: Z87.442

## 2021-09-06 LAB — TYPE AND SCREEN
ABO/RH(D): A POS
Antibody Screen: NEGATIVE

## 2021-09-06 LAB — CBC
HCT: 35.9 % — ABNORMAL LOW (ref 36.0–46.0)
Hemoglobin: 10.6 g/dL — ABNORMAL LOW (ref 12.0–15.0)
MCH: 21.2 pg — ABNORMAL LOW (ref 26.0–34.0)
MCHC: 29.5 g/dL — ABNORMAL LOW (ref 30.0–36.0)
MCV: 71.8 fL — ABNORMAL LOW (ref 80.0–100.0)
Platelets: 390 10*3/uL (ref 150–400)
RBC: 5 MIL/uL (ref 3.87–5.11)
RDW: 17.3 % — ABNORMAL HIGH (ref 11.5–15.5)
WBC: 9.6 10*3/uL (ref 4.0–10.5)
nRBC: 0 % (ref 0.0–0.2)

## 2021-09-06 LAB — POCT PREGNANCY, URINE: Preg Test, Ur: NEGATIVE

## 2021-09-06 LAB — GLUCOSE, CAPILLARY: Glucose-Capillary: 87 mg/dL (ref 70–99)

## 2021-09-06 LAB — ABO/RH: ABO/RH(D): A POS

## 2021-09-06 SURGERY — DILATATION & CURETTAGE/HYSTEROSCOPY WITH MYOSURE
Anesthesia: General | Site: Uterus

## 2021-09-06 MED ORDER — CEFAZOLIN SODIUM-DEXTROSE 2-4 GM/100ML-% IV SOLN
INTRAVENOUS | Status: AC
  Filled 2021-09-06: qty 100

## 2021-09-06 MED ORDER — LIDOCAINE-EPINEPHRINE (PF) 1 %-1:200000 IJ SOLN
INTRAMUSCULAR | Status: DC | PRN
  Administered 2021-09-06: 10 mL

## 2021-09-06 MED ORDER — PROPOFOL 1000 MG/100ML IV EMUL
INTRAVENOUS | Status: AC
  Filled 2021-09-06: qty 100

## 2021-09-06 MED ORDER — ACETAMINOPHEN 500 MG PO TABS
1000.0000 mg | ORAL_TABLET | Freq: Four times a day (QID) | ORAL | Status: DC | PRN
  Administered 2021-09-06: 1000 mg via ORAL

## 2021-09-06 MED ORDER — FENTANYL CITRATE (PF) 100 MCG/2ML IJ SOLN
INTRAMUSCULAR | Status: AC
  Filled 2021-09-06: qty 2

## 2021-09-06 MED ORDER — FENTANYL CITRATE (PF) 100 MCG/2ML IJ SOLN
INTRAMUSCULAR | Status: DC | PRN
  Administered 2021-09-06 (×2): 50 ug via INTRAVENOUS

## 2021-09-06 MED ORDER — PROPOFOL 10 MG/ML IV BOLUS
INTRAVENOUS | Status: DC | PRN
  Administered 2021-09-06: 250 mg via INTRAVENOUS
  Administered 2021-09-06: 50 mg via INTRAVENOUS

## 2021-09-06 MED ORDER — CEFAZOLIN SODIUM-DEXTROSE 2-4 GM/100ML-% IV SOLN
2.0000 g | INTRAVENOUS | Status: AC
  Administered 2021-09-06: 2 g via INTRAVENOUS

## 2021-09-06 MED ORDER — AMISULPRIDE (ANTIEMETIC) 5 MG/2ML IV SOLN: 10.0000 mg | Freq: Once | INTRAVENOUS | Status: DC | PRN

## 2021-09-06 MED ORDER — IBUPROFEN 800 MG PO TABS: 800.0000 mg | ORAL_TABLET | Freq: Three times a day (TID) | ORAL | 0 refills | Status: AC | PRN

## 2021-09-06 MED ORDER — POVIDONE-IODINE 10 % EX SWAB: 2.0000 "application " | Freq: Once | CUTANEOUS | Status: DC

## 2021-09-06 MED ORDER — LIDOCAINE 2% (20 MG/ML) 5 ML SYRINGE
INTRAMUSCULAR | Status: AC
  Filled 2021-09-06: qty 5

## 2021-09-06 MED ORDER — ACETAMINOPHEN 500 MG PO TABS
ORAL_TABLET | ORAL | Status: AC
  Filled 2021-09-06: qty 2

## 2021-09-06 MED ORDER — SODIUM CHLORIDE 0.9 % IR SOLN
Status: DC | PRN
  Administered 2021-09-06: 3000 mL

## 2021-09-06 MED ORDER — ONDANSETRON HCL 4 MG/2ML IJ SOLN
INTRAMUSCULAR | Status: DC | PRN
Start: 2021-09-06 — End: 2021-09-06
  Administered 2021-09-06: 4 mg via INTRAVENOUS

## 2021-09-06 MED ORDER — ONDANSETRON HCL 4 MG/2ML IJ SOLN
INTRAMUSCULAR | Status: AC
  Filled 2021-09-06: qty 2

## 2021-09-06 MED ORDER — KETOROLAC TROMETHAMINE 30 MG/ML IJ SOLN
INTRAMUSCULAR | Status: AC
  Filled 2021-09-06: qty 1

## 2021-09-06 MED ORDER — DEXAMETHASONE SODIUM PHOSPHATE 10 MG/ML IJ SOLN
INTRAMUSCULAR | Status: AC
  Filled 2021-09-06: qty 1

## 2021-09-06 MED ORDER — LACTATED RINGERS IV SOLN: INTRAVENOUS | Status: DC

## 2021-09-06 MED ORDER — MIDAZOLAM HCL 2 MG/2ML IJ SOLN
INTRAMUSCULAR | Status: AC
  Filled 2021-09-06: qty 2

## 2021-09-06 MED ORDER — KETOROLAC TROMETHAMINE 30 MG/ML IJ SOLN
INTRAMUSCULAR | Status: DC | PRN
  Administered 2021-09-06: 30 mg via INTRAVENOUS

## 2021-09-06 MED ORDER — MIDAZOLAM HCL 5 MG/5ML IJ SOLN
INTRAMUSCULAR | Status: DC | PRN
  Administered 2021-09-06: 2 mg via INTRAVENOUS

## 2021-09-06 MED ORDER — LIDOCAINE 2% (20 MG/ML) 5 ML SYRINGE
INTRAMUSCULAR | Status: DC | PRN
  Administered 2021-09-06: 100 mg via INTRAVENOUS

## 2021-09-06 MED ORDER — FENTANYL CITRATE (PF) 100 MCG/2ML IJ SOLN: 25.0000 ug | INTRAMUSCULAR | Status: DC | PRN

## 2021-09-06 MED ORDER — DEXAMETHASONE SODIUM PHOSPHATE 10 MG/ML IJ SOLN
INTRAMUSCULAR | Status: DC | PRN
  Administered 2021-09-06: 10 mg via INTRAVENOUS

## 2021-09-06 SURGICAL SUPPLY — 20 items
CATH ROBINSON RED A/P 16FR (CATHETERS) ×2 IMPLANT
DEVICE MYOSURE LITE (MISCELLANEOUS) IMPLANT
DEVICE MYOSURE REACH (MISCELLANEOUS) ×1 IMPLANT
DILATOR CANAL MILEX (MISCELLANEOUS) IMPLANT
DRSG TELFA 4X8 ISLAND (GAUZE/BANDAGES/DRESSINGS) ×1 IMPLANT
GAUZE 4X4 16PLY ~~LOC~~+RFID DBL (SPONGE) ×2 IMPLANT
GLOVE SURG LTX SZ6 (GLOVE) ×2 IMPLANT
GLOVE SURG UNDER POLY LF SZ6.5 (GLOVE) ×2 IMPLANT
GOWN STRL REUS W/TWL LRG LVL3 (GOWN DISPOSABLE) ×2 IMPLANT
HIBICLENS CHG 4% 4OZ (MISCELLANEOUS) IMPLANT
IV NS IRRIG 3000ML ARTHROMATIC (IV SOLUTION) ×2 IMPLANT
KIT PROCEDURE FLUENT (KITS) ×2 IMPLANT
KIT TURNOVER CYSTO (KITS) ×2 IMPLANT
MYOSURE XL FIBROID REM (MISCELLANEOUS)
PACK VAGINAL MINOR WOMEN LF (CUSTOM PROCEDURE TRAY) ×2 IMPLANT
PAD OB MATERNITY 4.3X12.25 (PERSONAL CARE ITEMS) ×2 IMPLANT
SEAL CERVICAL OMNI LOK (ABLATOR) IMPLANT
SEAL ROD LENS SCOPE MYOSURE (ABLATOR) ×2 IMPLANT
SYSTEM TISS REMOVAL MYSR XL RM (MISCELLANEOUS) IMPLANT
TOWEL OR 17X26 10 PK STRL BLUE (TOWEL DISPOSABLE) ×2 IMPLANT

## 2021-09-06 NOTE — H&P (Signed)
Lauren Stafford is an 45 y.o. female G4P2 with h/o AUB.  U/s showed thickened endometrium and SIS performed with 2 small endometrial polyps noted.  She is here for [facility], h/s, polypectomy.  She is currently on continuous ocps d/t anemia d/t her AUB.   Pertinent Gynecological History: See above  Menstrual History:  Patient's last menstrual period was 07/22/2021 (exact date). - on continuous ocps    Past Medical History:  Diagnosis Date   Anemia    last iron transfusion 05-03-2021   Bipolar disorder (HCC)    no current meds taken   Elevated blood pressure reading without diagnosis of hypertension 09/02/2021   130/88 bp trying to control with diet and exercise   Fatigue    per pt does not get much sleep at home, has child with type 1 dm   Gallstones 06/03/2021   History of cholelithiasis 06/03/2021   none since   History of kidney stones    History of posttraumatic stress disorder (PTSD)    well controlled for last 3 or 4years per pt on 09-02-2021   Pre-diabetes     Past Surgical History:  Procedure Laterality Date   arm surgery Left    due to fracture many yrs ago per pt on 09-02-2021   CESAREAN SECTION  2013    Family History  Problem Relation Age of Onset   Skin cancer Brother     Social History:  reports that she has never smoked. She has never used smokeless tobacco. She reports current alcohol use. She reports that she does not use drugs.  Allergies: No Known Allergies  Medications Prior to Admission  Medication Sig Dispense Refill Last Dose   PRESCRIPTION MEDICATION daily. Junel birth control pill daily   09/06/2021 at 0530    ROS SOB/chest pain/ HA/ vision changes/ LE pain or swelling/ etc.  Blood pressure (!) 149/85, pulse 86, temperature 98.8 F (37.1 C), temperature source Oral, resp. rate 16, height 5\' 8"  (1.727 m), weight 103.8 kg, last menstrual period 07/22/2021, SpO2 100 %. Physical Exam A&O x 3 HEENT -grossly wnl Lungs : ctab CV :  rrr Abdo : soft, nt, nd Extr : no edema, nt bilat Pelvic : deferred   Results for orders placed or performed during the hospital encounter of 09/06/21 (from the past 24 hour(s))  Pregnancy, urine POC     Status: None   Collection Time: 09/06/21  7:28 AM  Result Value Ref Range   Preg Test, Ur NEGATIVE NEGATIVE  CBC     Status: Abnormal   Collection Time: 09/06/21  7:45 AM  Result Value Ref Range   WBC 9.6 4.0 - 10.5 K/uL   RBC 5.00 3.87 - 5.11 MIL/uL   Hemoglobin 10.6 (L) 12.0 - 15.0 g/dL   HCT 09/08/21 (L) 10.9 - 32.3 %   MCV 71.8 (L) 80.0 - 100.0 fL   MCH 21.2 (L) 26.0 - 34.0 pg   MCHC 29.5 (L) 30.0 - 36.0 g/dL   RDW 55.7 (H) 32.2 - 02.5 %   Platelets 390 150 - 400 K/uL   nRBC 0.0 0.0 - 0.2 %  Glucose, capillary     Status: None   Collection Time: 09/06/21  8:17 AM  Result Value Ref Range   Glucose-Capillary 87 70 - 99 mg/dL    No results found.  Assessment/Plan: 45 y/o with AUB, endometrial polyps Reviewed risks of procedure: risk of bleeding (possible need for transfusion), infection, risk of uterine perforation, risk of further surgery,  risk of anexthesia, risk of blood clot to leg/lung.  Patient agrees to proceed, consent signed.  Proceed to OR Anemia - asymptomatic 3.  AUB-controlled on continuous ocps  Vick Frees 09/06/2021, 8:59 AM

## 2021-09-06 NOTE — Anesthesia Preprocedure Evaluation (Signed)
Anesthesia Evaluation  Patient identified by MRN, date of birth, ID band Patient awake    Reviewed: Allergy & Precautions, NPO status , Patient's Chart, lab work & pertinent test results  Airway Mallampati: II  TM Distance: >3 FB Neck ROM: Full    Dental  (+) Dental Advisory Given   Pulmonary neg pulmonary ROS,    breath sounds clear to auscultation       Cardiovascular negative cardio ROS   Rhythm:Regular Rate:Normal     Neuro/Psych negative neurological ROS     GI/Hepatic negative GI ROS, Neg liver ROS,   Endo/Other  negative endocrine ROS  Renal/GU negative Renal ROS     Musculoskeletal   Abdominal   Peds  Hematology  (+) anemia ,   Anesthesia Other Findings   Reproductive/Obstetrics                             Anesthesia Physical Anesthesia Plan  ASA: 2  Anesthesia Plan: General   Post-op Pain Management:    Induction: Intravenous  PONV Risk Score and Plan: 3 and Dexamethasone, Ondansetron, Midazolam and Treatment may vary due to age or medical condition  Airway Management Planned: LMA  Additional Equipment:   Intra-op Plan:   Post-operative Plan: Extubation in OR  Informed Consent: I have reviewed the patients History and Physical, chart, labs and discussed the procedure including the risks, benefits and alternatives for the proposed anesthesia with the patient or authorized representative who has indicated his/her understanding and acceptance.     Dental advisory given  Plan Discussed with: CRNA  Anesthesia Plan Comments:         Anesthesia Quick Evaluation

## 2021-09-06 NOTE — Anesthesia Procedure Notes (Signed)
Procedure Name: LMA Insertion Date/Time: 09/06/2021 9:16 AM Performed by: Briant Sites, CRNA Pre-anesthesia Checklist: Patient identified, Emergency Drugs available, Suction available and Patient being monitored Patient Re-evaluated:Patient Re-evaluated prior to induction Oxygen Delivery Method: Circle system utilized Preoxygenation: Pre-oxygenation with 100% oxygen Induction Type: IV induction Ventilation: Mask ventilation without difficulty LMA: LMA inserted LMA Size: 4.0 Number of attempts: 1 Airway Equipment and Method: Bite block Placement Confirmation: positive ETCO2 Dental Injury: Teeth and Oropharynx as per pre-operative assessment

## 2021-09-06 NOTE — Discharge Instructions (Signed)
No ibuprofen, Advil, Aleve, Motrin, ketorolac, meloxicam, naproxen, or other NSAIDS until after 3:45pm today if needed for pain.        DISCHARGE INSTRUCTIONS: D&C The following instructions have been prepared to help you care for yourself upon your return home.   Personal hygiene:  Use sanitary pads for vaginal drainage, not tampons.  Shower the day after your procedure.  NO tub baths, pools or Jacuzzis for 2-3 weeks.  Wipe front to back after using the bathroom.  Activity and limitations:  Do NOT drive or operate any equipment for 24 hours. The effects of anesthesia are still present and drowsiness may result.  Do NOT rest in bed all day.  Walking is encouraged.  Walk up and down stairs slowly.  You may resume your normal activity in one to two days or as indicated by your physician.  Sexual activity: NO intercourse for at least 2 weeks after the procedure, or as indicated by your physician.  Diet: Eat a light meal as desired this evening. You may resume your usual diet tomorrow.  Return to work: You may resume your work activities in one to two days or as indicated by your doctor.  What to expect after your surgery: Expect to have vaginal bleeding/discharge for 2-3 days and spotting for up to 10 days. It is not unusual to have soreness for up to 1-2 weeks. You may have a slight burning sensation when you urinate for the first day. Mild cramps may continue for a couple of days. You may have a regular period in 2-6 weeks.  Call your doctor for any of the following:  Excessive vaginal bleeding, saturating and changing one pad every hour.  Inability to urinate 6 hours after discharge from hospital.  Pain not relieved by pain medication.  Fever of 100.4 F or greater.  Unusual vaginal discharge or odor.   Call for an appointment: Schedule an appointment for two weeks         Post Anesthesia Home Care Instructions  Activity: Get plenty of rest for the remainder of the  day. A responsible individual must stay with you for 24 hours following the procedure.  For the next 24 hours, DO NOT: -Drive a car -Advertising copywriter -Drink alcoholic beverages -Take any medication unless instructed by your physician -Make any legal decisions or sign important papers.  Meals: Start with liquid foods such as gelatin or soup. Progress to regular foods as tolerated. Avoid greasy, spicy, heavy foods. If nausea and/or vomiting occur, drink only clear liquids until the nausea and/or vomiting subsides. Call your physician if vomiting continues.  Special Instructions/Symptoms: Your throat may feel dry or sore from the anesthesia or the breathing tube placed in your throat during surgery. If this causes discomfort, gargle with warm salt water. The discomfort should disappear within 24 hours.

## 2021-09-06 NOTE — Transfer of Care (Signed)
Immediate Anesthesia Transfer of Care Note  Patient: Lauren Stafford  Procedure(s) Performed: DILATATION & CURETTAGE/HYSTEROSCOPY WITH MYOSURE (Uterus)  Patient Location: PACU  Anesthesia Type:General  Level of Consciousness: awake, alert  and oriented  Airway & Oxygen Therapy: Patient Spontanous Breathing and Patient connected to nasal cannula oxygen  Post-op Assessment: Report given to RN  Post vital signs: Reviewed and stable  Last Vitals:  Vitals Value Taken Time  BP 155/82 09/06/21 0956  Temp    Pulse 122 09/06/21 0957  Resp 16 09/06/21 0957  SpO2 100 % 09/06/21 0957  Vitals shown include unvalidated device data.  Last Pain:  Vitals:   09/06/21 0743  TempSrc: Oral  PainSc: 0-No pain      Patients Stated Pain Goal: 5 (09/06/21 0743)  Complications: No notable events documented.

## 2021-09-06 NOTE — Anesthesia Postprocedure Evaluation (Signed)
Anesthesia Post Note  Patient: Lauren Stafford  Procedure(s) Performed: DILATATION & CURETTAGE/HYSTEROSCOPY WITH MYOSURE (Uterus)     Patient location during evaluation: PACU Anesthesia Type: General Level of consciousness: awake and alert Pain management: pain level controlled Vital Signs Assessment: post-procedure vital signs reviewed and stable Respiratory status: spontaneous breathing, nonlabored ventilation, respiratory function stable and patient connected to nasal cannula oxygen Cardiovascular status: blood pressure returned to baseline and stable Postop Assessment: no apparent nausea or vomiting Anesthetic complications: no   No notable events documented.  Last Vitals:  Vitals:   09/06/21 1045 09/06/21 1100  BP: 138/73 137/83  Pulse: 87 76  Resp: 14 17  Temp:  36.7 C  SpO2: 99% 99%    Last Pain:  Vitals:   09/06/21 1100  TempSrc:   PainSc: 0-No pain                 Kennieth Rad

## 2021-09-06 NOTE — Op Note (Addendum)
Preoperative diagnosis: menorrhagia, endometrial polyp  Postop diagnosis: as above.  Procedure: Hysteroscopic polypectomy with Jackquline [place], D&C Anesthesia General via LMA  Surgeon: Rhoderick Moody, MD  Assistant: none IV fluids : Estimated blood loss : 42ml Urine output: straight catheter preop : none Complications none  Condition stable  Disposition PACU  Specimen: endometrial polyps with endometrial curettings   Procedure  Indication: Menorrhagia/AUB. Office sono noted small fundal and posterior endometrial polyps. Patient was counseled on risks/ complications including infection, bleeding, damage to internal organs, she understood and agrees, gave informed written consent.  Patient was brought to the operating room with IV running. Time out was carried out. She received preop 2 gm Ancef. She underwent general anesthesia via LMA without complications. She was given dorsolithotomy position. Parts were prepped and draped in standard fashion. Bladder was catheterized once. Bimanual exam revealed uterus to be anteverted and normal size. Speculum was placed and cervix was grasped with single-tooth tenaculum. Cervical block with 10 cc 1% plain lidocaine with epinephrine given. The uterus was sounded to 10 cm. Cervical os was dilated to 21 [place] dilator. Hysteroscope was introduced in the uterine cavity under visualization. Findings: smal posterior polyp and small fundal polyp, normal ostea and cavity otherwise wnl. Hysteroscopic polypectomy was performed with Myosure Reach. Curettage then performed with myosure sampling all quadrants of cavity. Device used for about 30 seconds.  All speicmens sent to pathology.  Hysteroscope was removed.  Tenaculum removed and sites hemostatic.  Fluid deficit 270 cc.  All counts are correct x2. No complications. Patient was made supine dorsal anesthesia and brought to the recovery room in stable condition.  Patient will be discharged home today. Discharge meds  ibuprofen. Follow up in 2 weeks in office. Warning signs of infection and excessive bleeding reviewed.

## 2021-09-06 NOTE — Progress Notes (Signed)
RN notified Dr. Sampson Goon of patients blood pressure readings. Patients initial PACU blood pressure was 155/82 and trended up to 178/90 by her 30 minute assessment. Dr. Sampson Goon stated not to treat pressures unless they reached 180/100; at that point 5 of labetalol would be given. Patients last blood pressure before being transferred to Phase II was 138/73. Phase II nurse informed

## 2021-09-09 ENCOUNTER — Encounter (HOSPITAL_BASED_OUTPATIENT_CLINIC_OR_DEPARTMENT_OTHER): Payer: Self-pay | Admitting: Obstetrics and Gynecology

## 2021-09-09 LAB — SURGICAL PATHOLOGY

## 2021-11-21 IMAGING — US US ABDOMEN LIMITED
2 series · 14 of 25 positions shown · non-contrast
Comparison: Abdominal CT 09/08/2014

CLINICAL DATA: Right upper quadrant pain

EXAM:
ULTRASOUND ABDOMEN LIMITED RIGHT UPPER QUADRANT

[Series 1: us abdomen limited · 13 of 37 slices shown (1 of 2)]
[im 1/37]
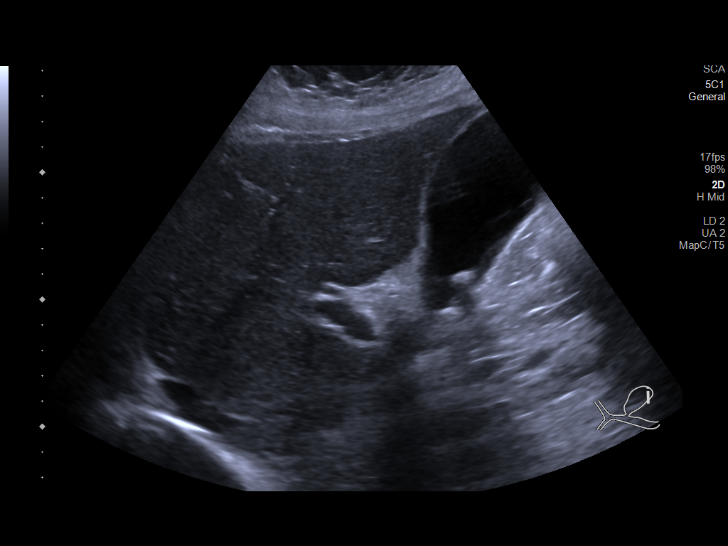
[im 4/37]
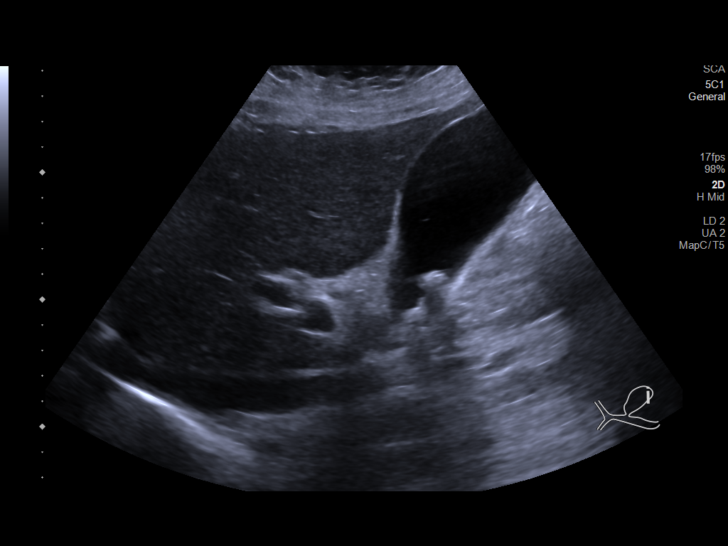
[im 7/37]
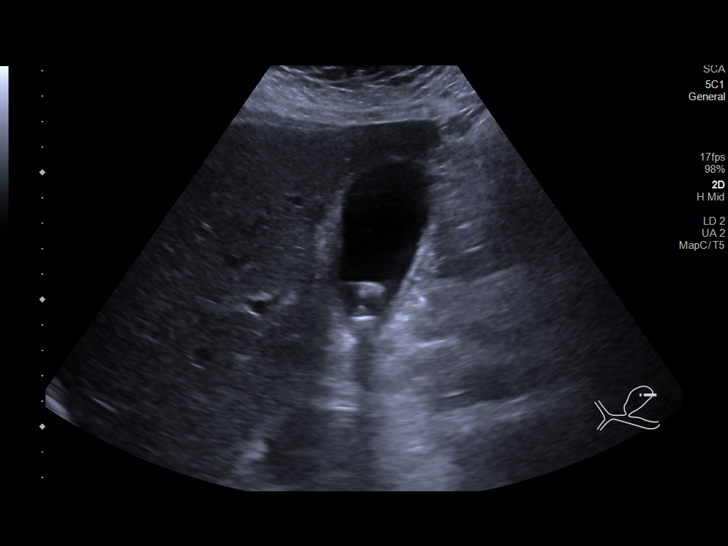
[im 10/37]
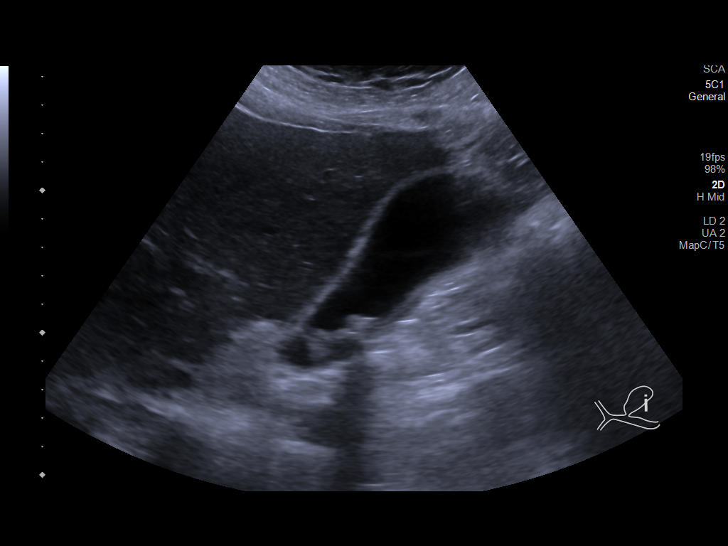
[im 13/37]
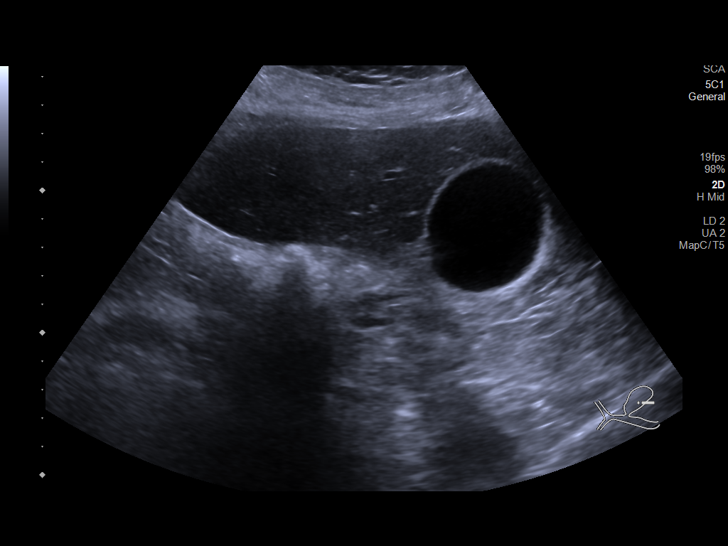
[im 15/37]
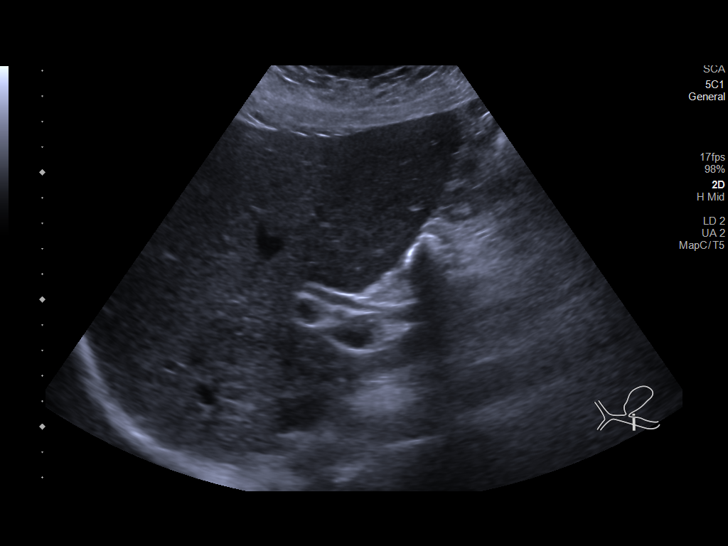
[im 18/37]
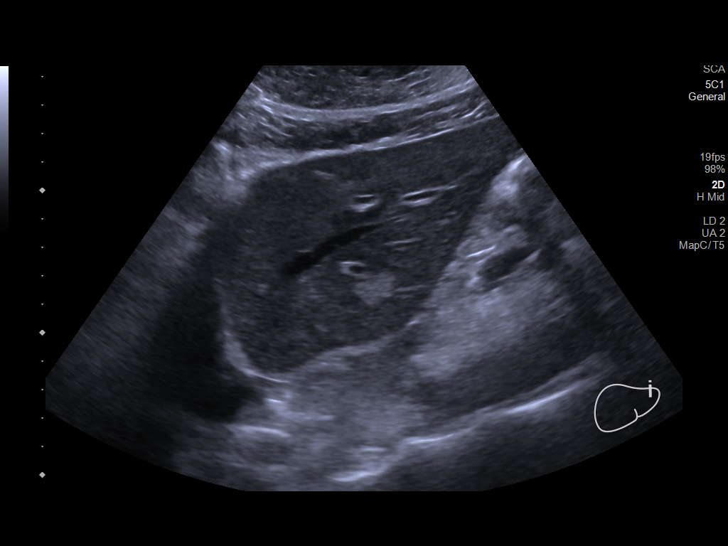
[im 21/37]
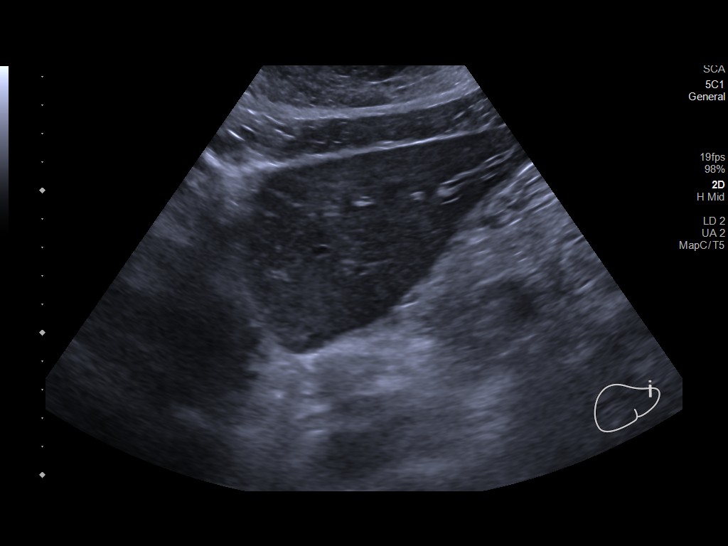
[im 24/37]
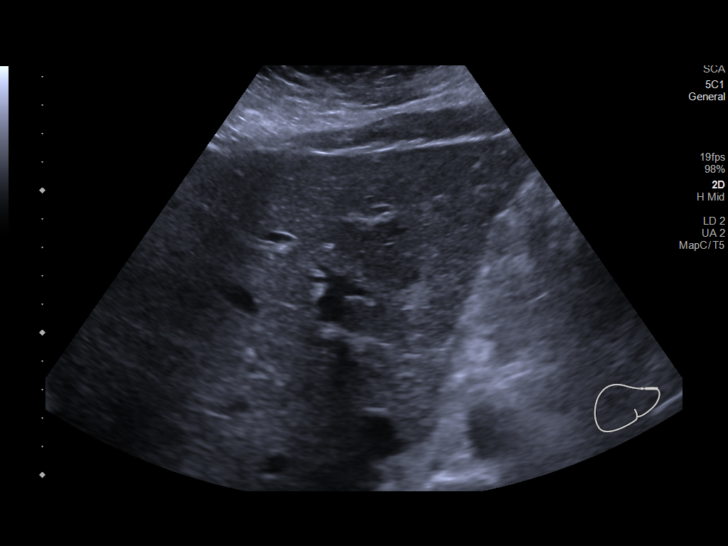
[im 26/37]
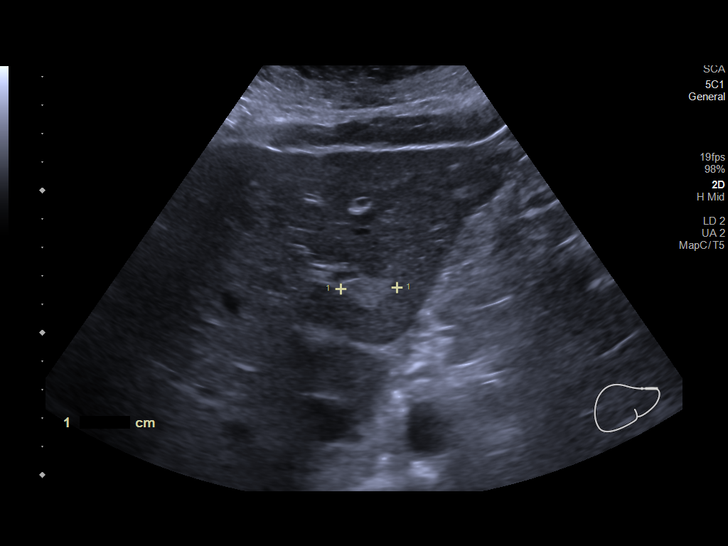
[im 29/37]
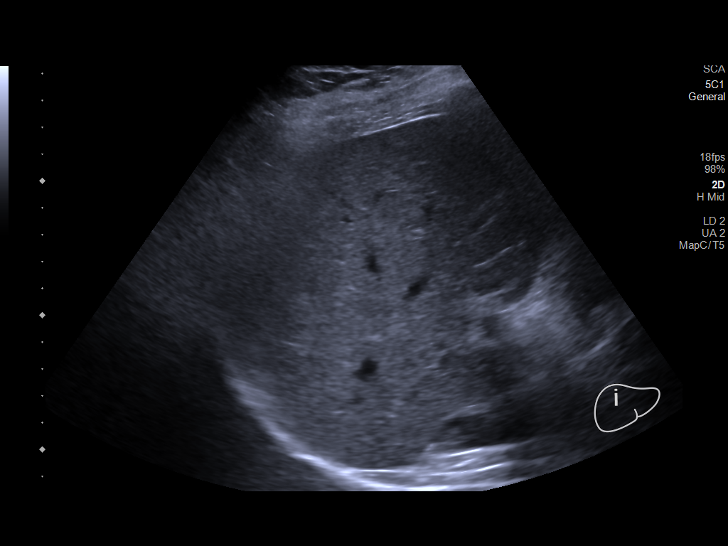
[im 32/37]
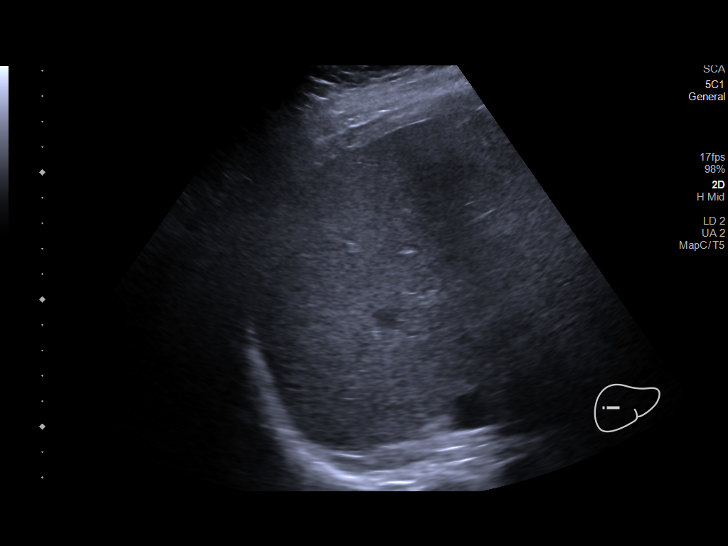
[im 35/37]
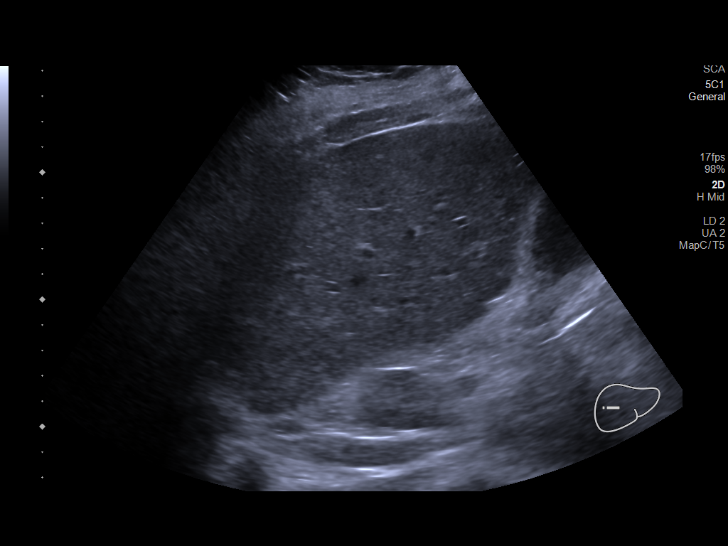

[Series 1001: us abdomen limited · 1 of 1 slices shown (2 of 2)]
[im 1/1]
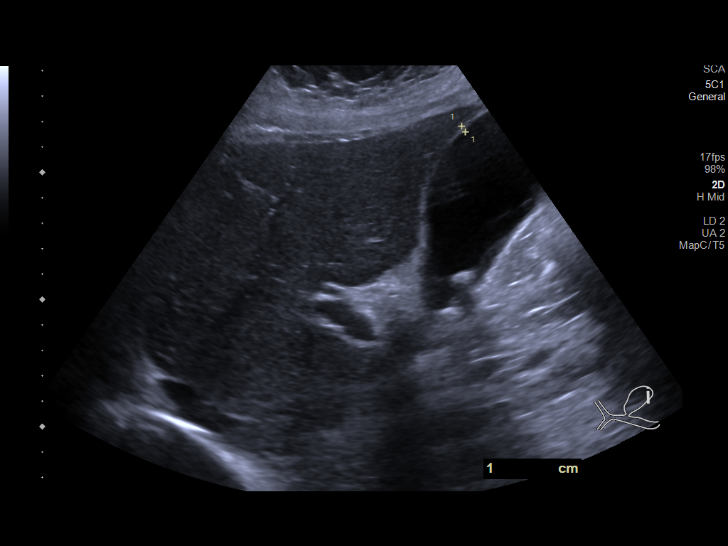

[14 of 25 positions shown; findings below may reference images not displayed]

FINDINGS: Gallbladder:

Small shadowing gallstones. No gallbladder wall thickening or focal
tenderness.

Common bile duct:

Diameter: 4-5 mm.  Where visualized, no filling defect.

Liver:

13 x 15 x 2 cm hyperechoic area in the left lobe liver. A
low-density in this area subtly seen retrospectively on a
noncontrast CT from 8692 and could be either hemangioma or focal fat
deposition. Portal vein is patent on color Doppler imaging with
normal direction of blood flow towards the liver.
IMPRESSION: Cholelithiasis without signs of acute cholecystitis.

## 2022-01-30 ENCOUNTER — Other Ambulatory Visit (HOSPITAL_COMMUNITY): Payer: Self-pay

## 2022-06-24 ENCOUNTER — Other Ambulatory Visit (HOSPITAL_COMMUNITY): Payer: Self-pay
